# Patient Record
Sex: Male | Born: 1960 | Race: White | Hispanic: No | Marital: Single | Smoking: Current every day smoker
Health system: Southern US, Community
[De-identification: ages and names within clinical notes are randomized; demographics above are authoritative.]

## PROBLEM LIST (undated history)

## (undated) DIAGNOSIS — I10 Essential (primary) hypertension: Secondary | ICD-10-CM

## (undated) DIAGNOSIS — M109 Gout, unspecified: Secondary | ICD-10-CM

## (undated) DIAGNOSIS — I471 Supraventricular tachycardia, unspecified: Secondary | ICD-10-CM

## (undated) DIAGNOSIS — K219 Gastro-esophageal reflux disease without esophagitis: Secondary | ICD-10-CM

## (undated) DIAGNOSIS — Z720 Tobacco use: Secondary | ICD-10-CM

## (undated) DIAGNOSIS — E119 Type 2 diabetes mellitus without complications: Secondary | ICD-10-CM

## (undated) DIAGNOSIS — I5189 Other ill-defined heart diseases: Secondary | ICD-10-CM

## (undated) DIAGNOSIS — G43909 Migraine, unspecified, not intractable, without status migrainosus: Secondary | ICD-10-CM

## (undated) DIAGNOSIS — R079 Chest pain, unspecified: Secondary | ICD-10-CM

## (undated) DIAGNOSIS — I4510 Unspecified right bundle-branch block: Secondary | ICD-10-CM

## (undated) HISTORY — PX: CLAVICLE SURGERY: SHX598

## (undated) HISTORY — PX: MANDIBLE SURGERY: SHX707

## (undated) HISTORY — PX: HERNIA REPAIR: SHX51

## (undated) HISTORY — DX: Migraine, unspecified, not intractable, without status migrainosus: G43.909

## (undated) HISTORY — PX: ANKLE SURGERY: SHX546

## (undated) HISTORY — DX: Gastro-esophageal reflux disease without esophagitis: K21.9

---

## 2004-08-21 ENCOUNTER — Emergency Department: Payer: Self-pay | Admitting: Emergency Medicine

## 2004-08-27 ENCOUNTER — Observation Stay: Payer: Self-pay | Admitting: General Practice

## 2010-10-15 ENCOUNTER — Ambulatory Visit: Payer: Self-pay | Admitting: Family Medicine

## 2011-07-29 ENCOUNTER — Emergency Department: Payer: Self-pay | Admitting: Emergency Medicine

## 2014-05-05 ENCOUNTER — Emergency Department: Payer: Self-pay | Admitting: Emergency Medicine

## 2014-05-16 ENCOUNTER — Emergency Department: Payer: Self-pay | Admitting: Student

## 2015-02-12 ENCOUNTER — Ambulatory Visit: Payer: Self-pay

## 2015-02-26 ENCOUNTER — Other Ambulatory Visit: Payer: Self-pay

## 2015-03-05 ENCOUNTER — Ambulatory Visit: Payer: Self-pay

## 2015-03-05 DIAGNOSIS — E785 Hyperlipidemia, unspecified: Secondary | ICD-10-CM | POA: Insufficient documentation

## 2015-03-06 ENCOUNTER — Other Ambulatory Visit: Payer: Self-pay | Admitting: Nurse Practitioner

## 2015-03-06 DIAGNOSIS — R51 Headache: Principal | ICD-10-CM

## 2015-03-06 DIAGNOSIS — R519 Headache, unspecified: Secondary | ICD-10-CM

## 2015-03-12 ENCOUNTER — Other Ambulatory Visit: Payer: Self-pay | Admitting: Nurse Practitioner

## 2015-03-12 ENCOUNTER — Ambulatory Visit: Payer: Self-pay

## 2015-03-12 ENCOUNTER — Ambulatory Visit
Admission: RE | Admit: 2015-03-12 | Discharge: 2015-03-12 | Disposition: A | Discharge status: Home / Self Care | Payer: PRIVATE HEALTH INSURANCE | Source: Ambulatory Visit | Attending: Nurse Practitioner | Admitting: Nurse Practitioner

## 2015-03-12 ENCOUNTER — Ambulatory Visit
Admission: RE | Admit: 2015-03-12 | Discharge: 2015-03-12 | Disposition: A | Discharge status: Home / Self Care | Payer: Self-pay | Source: Ambulatory Visit | Attending: Nurse Practitioner | Admitting: Nurse Practitioner

## 2015-03-12 DIAGNOSIS — G9389 Other specified disorders of brain: Secondary | ICD-10-CM | POA: Insufficient documentation

## 2015-03-12 DIAGNOSIS — R0602 Shortness of breath: Secondary | ICD-10-CM

## 2015-03-12 DIAGNOSIS — R519 Headache, unspecified: Secondary | ICD-10-CM

## 2015-03-12 DIAGNOSIS — R51 Headache: Secondary | ICD-10-CM | POA: Insufficient documentation

## 2015-03-19 ENCOUNTER — Ambulatory Visit: Payer: PRIVATE HEALTH INSURANCE

## 2015-03-24 ENCOUNTER — Ambulatory Visit: Payer: PRIVATE HEALTH INSURANCE

## 2015-03-30 ENCOUNTER — Encounter: Payer: PRIVATE HEALTH INSURANCE | Admitting: Pharmacist

## 2015-05-21 ENCOUNTER — Other Ambulatory Visit: Payer: Self-pay

## 2015-05-28 ENCOUNTER — Ambulatory Visit: Payer: Self-pay

## 2015-05-28 DIAGNOSIS — E1110 Type 2 diabetes mellitus with ketoacidosis without coma: Secondary | ICD-10-CM | POA: Insufficient documentation

## 2015-06-23 ENCOUNTER — Emergency Department: Payer: Medicaid Other

## 2015-06-23 ENCOUNTER — Inpatient Hospital Stay
Admission: EM | Admit: 2015-06-23 | Discharge: 2015-06-25 | DRG: 603 | Disposition: A | Discharge status: Home / Self Care | Payer: Medicaid Other | Attending: Internal Medicine | Admitting: Internal Medicine

## 2015-06-23 ENCOUNTER — Encounter: Payer: Self-pay | Admitting: *Deleted

## 2015-06-23 DIAGNOSIS — F17210 Nicotine dependence, cigarettes, uncomplicated: Secondary | ICD-10-CM | POA: Diagnosis present

## 2015-06-23 DIAGNOSIS — E119 Type 2 diabetes mellitus without complications: Secondary | ICD-10-CM | POA: Diagnosis present

## 2015-06-23 DIAGNOSIS — L03114 Cellulitis of left upper limb: Principal | ICD-10-CM | POA: Diagnosis present

## 2015-06-23 DIAGNOSIS — W5501XA Bitten by cat, initial encounter: Secondary | ICD-10-CM | POA: Diagnosis present

## 2015-06-23 DIAGNOSIS — Z79899 Other long term (current) drug therapy: Secondary | ICD-10-CM

## 2015-06-23 DIAGNOSIS — I10 Essential (primary) hypertension: Secondary | ICD-10-CM | POA: Diagnosis present

## 2015-06-23 DIAGNOSIS — K219 Gastro-esophageal reflux disease without esophagitis: Secondary | ICD-10-CM | POA: Diagnosis present

## 2015-06-23 DIAGNOSIS — E785 Hyperlipidemia, unspecified: Secondary | ICD-10-CM | POA: Diagnosis present

## 2015-06-23 HISTORY — DX: Essential (primary) hypertension: I10

## 2015-06-23 HISTORY — DX: Type 2 diabetes mellitus without complications: E11.9

## 2015-06-23 LAB — BASIC METABOLIC PANEL
ANION GAP: 10 (ref 5–15)
BUN: 12 mg/dL (ref 6–20)
CALCIUM: 10.4 mg/dL — AB (ref 8.9–10.3)
CO2: 24 mmol/L (ref 22–32)
Chloride: 104 mmol/L (ref 101–111)
Creatinine, Ser: 1.19 mg/dL (ref 0.61–1.24)
Glucose, Bld: 131 mg/dL — ABNORMAL HIGH (ref 65–99)
POTASSIUM: 3.6 mmol/L (ref 3.5–5.1)
SODIUM: 138 mmol/L (ref 135–145)

## 2015-06-23 LAB — CBC
HEMATOCRIT: 46.8 % (ref 40.0–52.0)
HEMOGLOBIN: 15.9 g/dL (ref 13.0–18.0)
MCH: 30.9 pg (ref 26.0–34.0)
MCHC: 33.9 g/dL (ref 32.0–36.0)
MCV: 91.2 fL (ref 80.0–100.0)
Platelets: 179 10*3/uL (ref 150–440)
RBC: 5.13 MIL/uL (ref 4.40–5.90)
RDW: 14.8 % — ABNORMAL HIGH (ref 11.5–14.5)
WBC: 10.1 10*3/uL (ref 3.8–10.6)

## 2015-06-23 LAB — LACTIC ACID, PLASMA: LACTIC ACID, VENOUS: 1 mmol/L (ref 0.5–2.0)

## 2015-06-23 MED ORDER — HEPARIN SODIUM (PORCINE) 5000 UNIT/ML IJ SOLN
5000.0000 [IU] | Freq: Three times a day (TID) | INTRAMUSCULAR | Status: DC
  Administered 2015-06-23 – 2015-06-24 (×2): 5000 [IU] via SUBCUTANEOUS
  Filled 2015-06-23 (×3): qty 1

## 2015-06-23 MED ORDER — RABIES VACCINE, PCEC IM SUSR
1.0000 mL | Freq: Once | INTRAMUSCULAR | Status: AC
  Administered 2015-06-23: 1 mL via INTRAMUSCULAR
  Filled 2015-06-23: qty 1

## 2015-06-23 MED ORDER — HYDRALAZINE HCL 20 MG/ML IJ SOLN: 10.0000 mg | INTRAMUSCULAR | Status: DC | PRN

## 2015-06-23 MED ORDER — INSULIN ASPART 100 UNIT/ML ~~LOC~~ SOLN
0.0000 [IU] | Freq: Three times a day (TID) | SUBCUTANEOUS | Status: DC
  Filled 2015-06-23: qty 1

## 2015-06-23 MED ORDER — SODIUM CHLORIDE 0.9 % IV SOLN
3.0000 g | Freq: Three times a day (TID) | INTRAVENOUS | Status: DC
  Administered 2015-06-24 – 2015-06-25 (×4): 3 g via INTRAVENOUS
  Filled 2015-06-23 (×7): qty 3

## 2015-06-23 MED ORDER — GABAPENTIN 300 MG PO CAPS
300.0000 mg | ORAL_CAPSULE | Freq: Three times a day (TID) | ORAL | Status: DC
  Administered 2015-06-24 – 2015-06-25 (×5): 300 mg via ORAL
  Filled 2015-06-23 (×6): qty 1

## 2015-06-23 MED ORDER — SODIUM CHLORIDE 0.9 % IV SOLN
3.0000 g | Freq: Once | INTRAVENOUS | Status: AC
  Administered 2015-06-23: 3 g via INTRAVENOUS
  Filled 2015-06-23: qty 3

## 2015-06-23 MED ORDER — SODIUM CHLORIDE 0.9 % IV SOLN
INTRAVENOUS | Status: DC
  Administered 2015-06-23 – 2015-06-25 (×4): via INTRAVENOUS

## 2015-06-23 MED ORDER — ONDANSETRON HCL 4 MG PO TABS: 4.0000 mg | ORAL_TABLET | Freq: Four times a day (QID) | ORAL | Status: DC | PRN

## 2015-06-23 MED ORDER — TETANUS-DIPHTH-ACELL PERTUSSIS 5-2.5-18.5 LF-MCG/0.5 IM SUSP
0.5000 mL | Freq: Once | INTRAMUSCULAR | Status: AC
  Administered 2015-06-23: 0.5 mL via INTRAMUSCULAR
  Filled 2015-06-23: qty 0.5

## 2015-06-23 MED ORDER — ROSUVASTATIN CALCIUM 5 MG PO TABS
5.0000 mg | ORAL_TABLET | Freq: Every day | ORAL | Status: DC
  Administered 2015-06-24: 18:00:00 5 mg via ORAL
  Filled 2015-06-23: qty 1

## 2015-06-23 MED ORDER — ACETAMINOPHEN 325 MG PO TABS: 650.0000 mg | ORAL_TABLET | Freq: Four times a day (QID) | ORAL | Status: DC | PRN

## 2015-06-23 MED ORDER — OXYCODONE HCL 5 MG PO TABS
5.0000 mg | ORAL_TABLET | ORAL | Status: DC | PRN
  Administered 2015-06-24 – 2015-06-25 (×6): 5 mg via ORAL
  Filled 2015-06-23 (×7): qty 1

## 2015-06-23 MED ORDER — MORPHINE SULFATE (PF) 2 MG/ML IV SOLN: 2.0000 mg | INTRAVENOUS | Status: DC | PRN

## 2015-06-23 MED ORDER — PANTOPRAZOLE SODIUM 40 MG PO TBEC
40.0000 mg | DELAYED_RELEASE_TABLET | Freq: Every day | ORAL | Status: DC
Start: 2015-06-24 — End: 2015-06-25
  Administered 2015-06-24 – 2015-06-25 (×2): 40 mg via ORAL
  Filled 2015-06-23 (×2): qty 1

## 2015-06-23 MED ORDER — INSULIN ASPART 100 UNIT/ML ~~LOC~~ SOLN: 0.0000 [IU] | Freq: Every day | SUBCUTANEOUS | Status: DC

## 2015-06-23 MED ORDER — MORPHINE SULFATE (PF) 4 MG/ML IV SOLN
4.0000 mg | Freq: Once | INTRAVENOUS | Status: AC
  Administered 2015-06-23: 4 mg via INTRAVENOUS
  Filled 2015-06-23: qty 1

## 2015-06-23 MED ORDER — RABIES IMMUNE GLOBULIN 150 UNIT/ML IM INJ
20.0000 [IU]/kg | INJECTION | Freq: Once | INTRAMUSCULAR | Status: AC
  Administered 2015-06-23: 2025 [IU] via INTRAMUSCULAR
  Filled 2015-06-23: qty 13.5

## 2015-06-23 MED ORDER — ACETAMINOPHEN 650 MG RE SUPP
650.0000 mg | Freq: Four times a day (QID) | RECTAL | Status: DC | PRN
Start: 2015-06-23 — End: 2015-06-25

## 2015-06-23 MED ORDER — ONDANSETRON HCL 4 MG/2ML IJ SOLN
4.0000 mg | Freq: Four times a day (QID) | INTRAMUSCULAR | Status: DC | PRN
  Administered 2015-06-24: 01:00:00 4 mg via INTRAVENOUS
  Filled 2015-06-23: qty 2

## 2015-06-23 MED ORDER — AMLODIPINE BESYLATE 5 MG PO TABS
5.0000 mg | ORAL_TABLET | Freq: Every day | ORAL | Status: DC
  Administered 2015-06-24 – 2015-06-25 (×2): 5 mg via ORAL
  Filled 2015-06-23 (×2): qty 1

## 2015-06-23 NOTE — H&P (Signed)
Micheal Barrett    MR#:  [id number]  DATE OF BIRTH:  1961/09/09   DATE OF ADMISSION:  06/23/2015  PRIMARY CARE PHYSICIAN: Boyce Medici, FNP   REQUESTING/REFERRING PHYSICIAN: Archie Balboa  CHIEF COMPLAINT:   Chief Complaint  Patient presents with  . Animal Bite    HISTORY OF PRESENT ILLNESS:  Micheal Barrett  is a 54 y.o. male with a known history of diabetes non-insulin-requiring, essential hypertension presenting with left hand swelling. Describes getting a cat bite to the left hand about 2-3 days ago. He states that he was attempting to rescue a stray cat who had a weedeater straining wrapped around his neck. Fortunately he was able to free the cat unfortunately he has been in the left hand 3-4 times. Had immediate bleeding at the site. Over the next few days describes increased edema, redness, pain described as throbbing 5-6/10 intensity, nonradiating worsened with activity no relieving factors. Denies any subjective fevers or chills  PAST MEDICAL HISTORY:   Past Medical History  Diagnosis Date  . Hypertension     PAST SURGICAL HISTORY:  No past surgical history on file.  SOCIAL HISTORY:   Social History  Substance Use Topics  . Smoking status: Current Every Day Smoker  . Smokeless tobacco: Not on file  . Alcohol Use: Yes    FAMILY HISTORY:  No family history on file.  DRUG ALLERGIES:  No Known Allergies  REVIEW OF SYSTEMS:  REVIEW OF SYSTEMS:  CONSTITUTIONAL: Denies fevers, chills, fatigue, weakness.  EYES: Denies blurred vision, double vision, or eye pain.  EARS, NOSE, THROAT: Denies tinnitus, ear pain, hearing loss.  RESPIRATORY: denies cough, shortness of breath, wheezing  CARDIOVASCULAR: Denies chest pain, palpitations, edema.  GASTROINTESTINAL: Denies nausea, vomiting, diarrhea, abdominal pain.  GENITOURINARY: Denies dysuria, hematuria.  ENDOCRINE: Denies nocturia or thyroid  problems. HEMATOLOGIC AND LYMPHATIC: Denies easy bruising or bleeding.  SKIN:  rash left hand MUSCULOSKELETAL: Denies pain in neck, back, shoulder, knees, hips, or further arthritic symptoms.  NEUROLOGIC: Denies paralysis, paresthesias.  PSYCHIATRIC: Denies anxiety or depressive symptoms. Otherwise full review of systems performed by me is negative.   MEDICATIONS AT HOME:   Prior to Admission medications   Medication Sig Start Date End Date Taking? Authorizing Provider  amLODipine (NORVASC) 5 MG tablet Take 5 mg by mouth daily.   Yes Historical Provider, MD  gabapentin (NEURONTIN) 300 MG capsule Take 300 mg by mouth 3 (three) times daily.   Yes Historical Provider, MD  metFORMIN (GLUCOPHAGE) 500 MG tablet Take 500 mg by mouth every evening.   Yes Historical Provider, MD  omeprazole (PRILOSEC) 20 MG capsule Take 20 mg by mouth daily.   Yes Historical Provider, MD  rosuvastatin (CRESTOR) 5 MG tablet Take 5 mg by mouth daily at 6 PM.   Yes Historical Provider, MD      VITAL SIGNS:  Blood pressure 115/101, pulse 79, temperature 98.3 F (36.8 C), temperature source Oral, resp. rate 18, height 5\' 11"  (1.803 m), weight 220 lb (99.791 kg), SpO2 96 %.  PHYSICAL EXAMINATION:  VITAL SIGNS: Filed Vitals:   06/23/15 2200  BP: 115/101  Pulse: 79  Temp:   Resp:    GENERAL:53 y.o.male currently in no acute distress.  HEAD: Normocephalic, atraumatic.  EYES: Pupils equal, round, reactive to light. Extraocular muscles intact. No scleral icterus.  MOUTH: Moist mucosal membrane. Dentition intact. No abscess noted.  EAR, NOSE, THROAT: Clear without exudates.  No external lesions.  NECK: Supple. No thyromegaly. No nodules. No JVD.  PULMONARY: Clear to ascultation, without wheeze rails or rhonci. No use of accessory muscles, Good respiratory effort. good air entry bilaterally CHEST: Nontender to palpation.  CARDIOVASCULAR: S1 and S2. Regular rate and rhythm. No murmurs, rubs, or gallops. No edema.  Pedal pulses 2+ bilaterally.  GASTROINTESTINAL: Soft, nontender, nondistended. No masses. Positive bowel sounds. No hepatosplenomegaly.  MUSCULOSKELETAL: No swelling, clubbing. Range of motion full in all extremities. Edema of the left hand mainly the dorsum moving upward into the wrist NEUROLOGIC: Cranial nerves II through XII are intact. No gross focal neurological deficits. Sensation intact. Reflexes intact.  SKIN: Erythematous area left hand dorsum moving proximally up the wrist, punctate lesions 3 without any purulence or drainage. Otherwise No ulceration, lesions, rashes, or cyanosis. Skin warm and dry. Turgor intact.  PSYCHIATRIC: Mood, affect within normal limits. The patient is awake, alert and oriented x 3. Insight, judgment intact.    LABORATORY PANEL:   CBC  Recent Labs Lab 06/23/15 1841  WBC 10.1  HGB 15.9  HCT 46.8  PLT 179   ------------------------------------------------------------------------------------------------------------------  Chemistries   Recent Labs Lab 06/23/15 1841  NA 138  K 3.6  CL 104  CO2 24  GLUCOSE 131*  BUN 12  CREATININE 1.19  CALCIUM 10.4*   ------------------------------------------------------------------------------------------------------------------  Cardiac Enzymes No results for input(s): TROPONINI in the last 168 hours. ------------------------------------------------------------------------------------------------------------------  RADIOLOGY:  Dg Hand Complete Left  06/23/2015   CLINICAL DATA:  Bit by stray cat left hand, index and middle fingers. Swelling, warmth and pain left hand.  EXAM: LEFT HAND - COMPLETE 3+ VIEW  COMPARISON:  None.  FINDINGS: Old ulnar styloid fracture. Mild degenerate changes over the radiocarpal joint and carpal bones as well as first carpometacarpal and first MCP joints. No evidence of fracture, dislocation or foreign body.  IMPRESSION: No acute findings.   Electronically Signed   By: Marin Olp M.D.   On: 06/23/2015 21:41    EKG:   Orders placed or performed in visit on 03/12/15  . EKG 12-Lead    IMPRESSION AND PLAN:   54 year old Caucasian gentleman history of hyperlipidemia unspecified, type 2 diabetes uncomplicated presenting after a cat bite to left hand  1. Left hand cellulitis, cat bite: Started on Unasyn in the emergency department will continue Unasyn every 6 hours if there is improvement can hopefully be switched to oral medications as soon as tomorrow. Has received tetanus vaccine and is scheduled to receive the rabies vaccine from the emergency department staff 2. Type 2 diabetes uncomplicated: Hold oral agents at insulin sliding scale 3. Essential hypertension: Continue with Norvasc added as needed hydralazine 4. GERD without esophagitis: PPI therapy 5. Hyperlipidemia unspecified: Crestor 6. Venous thromboembolism prophylactic: Heparin subcutaneous  All the records are reviewed and case discussed with ED provider. Management plans discussed with the patient, family and they are in agreement.  CODE STATUS: Full  TOTAL TIME TAKING CARE OF THIS PATIENT: 35 minutes.    Gussie Murton,  Karenann Cai.D on 06/23/2015 at 10:35 PM  Between 7am to 6pm - Pager - [phone]  After 6pm: House Pager: - [facility]  Office  [phone]  CC: Primary care physician; Boyce Medici, FNP

## 2015-06-23 NOTE — ED Provider Notes (Signed)
[facility] Provider Note   ____________________________________________  Time seen: 2100  I have reviewed the triage vital signs and the nursing notes.   HISTORY  Chief Complaint Animal Bite   History limited by: Not Limited   HPI Micheal Barrett is a 54 y.o. male who presents to the emergency department today because of concerns for pain, swelling and redness to his left hand. Patient states that he was bit by a cat 2 days ago. He states this was a That his family had taken it off the street roughly 3 weeks ago. They Hit him after he tried to free the cat from a tangled wire. He is unsure of the cats vaccinated status. The patient denies any fevers or nausea or vomiting. Patient is unsure when his last tetanus shot was.   Past Medical History  Diagnosis Date  . Hypertension     There are no active problems to display for this patient.   No past surgical history on file.  No current outpatient prescriptions on file.  Allergies Review of patient's allergies indicates no known allergies.  No family history on file.  Social History Social History  Substance Use Topics  . Smoking status: Current Every Day Smoker  . Smokeless tobacco: None  . Alcohol Use: Yes    Review of Systems  Constitutional: Negative for fever. Cardiovascular: Negative for chest pain. Respiratory: Negative for shortness of breath. Gastrointestinal: Negative for abdominal pain, vomiting and diarrhea. Genitourinary: Negative for dysuria. Musculoskeletal: Left hand pain and swelling Skin: Left hand redness, swelling and multiple small puncture wounds. Neurological: Negative for headaches, focal weakness or numbness. 10-point ROS otherwise negative.  ____________________________________________   PHYSICAL EXAM:  VITAL SIGNS: ED Triage Vitals  Enc Vitals Group     BP 06/23/15 1832 131/96 mmHg     Pulse Rate 06/23/15 1832 91     Resp 06/23/15  1832 18     Temp 06/23/15 1832 98.3 F (36.8 C)     Temp Source 06/23/15 1832 Oral     SpO2 06/23/15 1832 97 %     Weight 06/23/15 1832 220 lb (99.791 kg)     Height 06/23/15 1832 5\' 11"  (1.803 m)     Head Cir --      Peak Flow --      Pain Score 06/23/15 1832 9   Constitutional: Alert and oriented. Well appearing and in no distress. Eyes: Conjunctivae are normal. PERRL. Normal extraocular movements. ENT   Head: Normocephalic and atraumatic.   Nose: No congestion/rhinnorhea.   Mouth/Throat: Mucous membranes are moist.   Neck: No stridor. Hematological/Lymphatic/Immunilogical: No cervical lymphadenopathy. Cardiovascular: Normal rate, regular rhythm.  No murmurs, rubs, or gallops. Respiratory: Normal respiratory effort without tachypnea nor retractions. Breath sounds are clear and equal bilaterally. No wheezes/rales/rhonchi. Gastrointestinal: Soft and nontender. No distention.  Genitourinary: Deferred Musculoskeletal: Normal range of motion in all extremities. No joint effusions.  No lower extremity tenderness nor edema. Neurologic:  Normal speech and language. No gross focal neurologic deficits are appreciated. Speech is normal.  Skin:  Skin is warm, dry. Swelling and erythema to the left hand, primarily in the first and second digits however extends down past the wrist. Small puncture wounds noted at the base of the first digit and the proximal phalanx of the second digit. No pus was elicited. Compartments were soft. No pain with extension of the first or second digits. Psychiatric: Mood and affect are normal. Speech and behavior are normal. Patient  exhibits appropriate insight and judgment.  ____________________________________________    LABS (pertinent positives/negatives)  Labs Reviewed  BASIC METABOLIC PANEL - Abnormal; Notable for the following:    Glucose, Bld 131 (*)    Calcium 10.4 (*)    All other components within normal limits  CBC - Abnormal; Notable for  the following:    RDW 14.8 (*)    All other components within normal limits  LACTIC ACID, PLASMA  LACTIC ACID, PLASMA     ____________________________________________   EKG  None  ____________________________________________    RADIOLOGY  Left hand x-ray IMPRESSION: No acute findings.  I, Treyon Wymore, personally viewed and evaluated these images (plain radiographs) as part of my medical decision making.   ____________________________________________   PROCEDURES  Procedure(s) performed: None  Critical Care performed: No  ____________________________________________   INITIAL IMPRESSION / ASSESSMENT AND PLAN / ED COURSE  Pertinent labs & imaging results that were available during my care of the patient were reviewed by me and considered in my medical decision making (see chart for details).  Patient presented to the emergency department today with concerns for left hand pain and swelling after getting bit by cat. On exam patient does have swelling erythema and tenderness to his left hand with a couple puncture wounds. X-ray did not show any foreign bodies. Given the extensive swelling and erythema do think patient would benefit from IV antibiotics. Additionally I will update his tetanus. Furthermore given that this was a stray cat with unknown vaccination status we will give rabies vaccine immunoglobulin.  ____________________________________________   FINAL CLINICAL IMPRESSION(S) / ED DIAGNOSES  Final diagnoses:  Cat bite, initial encounter  Cellulitis of left upper extremity     Nance Pear, MD 06/23/15 2312

## 2015-06-23 NOTE — ED Notes (Signed)
Pt states that he was bit by a stray cat multiple times on his left hand. Pt now with swelling, warmth and pain to the left hand.

## 2015-06-24 DIAGNOSIS — I10 Essential (primary) hypertension: Secondary | ICD-10-CM | POA: Diagnosis present

## 2015-06-24 DIAGNOSIS — L03114 Cellulitis of left upper limb: Secondary | ICD-10-CM | POA: Diagnosis present

## 2015-06-24 DIAGNOSIS — W5501XA Bitten by cat, initial encounter: Secondary | ICD-10-CM | POA: Diagnosis present

## 2015-06-24 DIAGNOSIS — F17210 Nicotine dependence, cigarettes, uncomplicated: Secondary | ICD-10-CM | POA: Diagnosis present

## 2015-06-24 DIAGNOSIS — E785 Hyperlipidemia, unspecified: Secondary | ICD-10-CM | POA: Diagnosis present

## 2015-06-24 DIAGNOSIS — Z79899 Other long term (current) drug therapy: Secondary | ICD-10-CM | POA: Diagnosis not present

## 2015-06-24 DIAGNOSIS — E119 Type 2 diabetes mellitus without complications: Secondary | ICD-10-CM | POA: Diagnosis present

## 2015-06-24 DIAGNOSIS — K219 Gastro-esophageal reflux disease without esophagitis: Secondary | ICD-10-CM | POA: Diagnosis present

## 2015-06-24 LAB — GLUCOSE, CAPILLARY
GLUCOSE-CAPILLARY: 119 mg/dL — AB (ref 65–99)
Glucose-Capillary: 106 mg/dL — ABNORMAL HIGH (ref 65–99)
Glucose-Capillary: 107 mg/dL — ABNORMAL HIGH (ref 65–99)
Glucose-Capillary: 111 mg/dL — ABNORMAL HIGH (ref 65–99)
Glucose-Capillary: 142 mg/dL — ABNORMAL HIGH (ref 65–99)

## 2015-06-24 LAB — CBC WITH DIFFERENTIAL/PLATELET
Basophils Absolute: 0 10*3/uL (ref 0–0.1)
Basophils Relative: 1 %
Eosinophils Absolute: 0.1 10*3/uL (ref 0–0.7)
Eosinophils Relative: 1 %
HEMATOCRIT: 41.7 % (ref 40.0–52.0)
HEMOGLOBIN: 14.2 g/dL (ref 13.0–18.0)
LYMPHS PCT: 16 %
Lymphs Abs: 1.4 10*3/uL (ref 1.0–3.6)
MCH: 30.9 pg (ref 26.0–34.0)
MCHC: 34 g/dL (ref 32.0–36.0)
MCV: 91.1 fL (ref 80.0–100.0)
MONO ABS: 0.6 10*3/uL (ref 0.2–1.0)
MONOS PCT: 7 %
NEUTROS ABS: 6.5 10*3/uL (ref 1.4–6.5)
Neutrophils Relative %: 75 %
Platelets: 159 10*3/uL (ref 150–440)
RBC: 4.58 MIL/uL (ref 4.40–5.90)
RDW: 14.8 % — AB (ref 11.5–14.5)
WBC: 8.7 10*3/uL (ref 3.8–10.6)

## 2015-06-24 LAB — BASIC METABOLIC PANEL
ANION GAP: 5 (ref 5–15)
BUN: 13 mg/dL (ref 6–20)
CALCIUM: 9.1 mg/dL (ref 8.9–10.3)
CHLORIDE: 108 mmol/L (ref 101–111)
CO2: 25 mmol/L (ref 22–32)
CREATININE: 0.93 mg/dL (ref 0.61–1.24)
GFR calc Af Amer: >60 mL/min (ref >60)
GFR calc non Af Amer: >60 mL/min (ref >60)
GLUCOSE: 145 mg/dL — AB (ref 65–99)
Potassium: 4.3 mmol/L (ref 3.5–5.1)
Sodium: 138 mmol/L (ref 135–145)

## 2015-06-24 MED ORDER — METFORMIN HCL 500 MG PO TABS: 500.0000 mg | ORAL_TABLET | Freq: Two times a day (BID) | ORAL | Status: DC

## 2015-06-24 MED ORDER — METFORMIN HCL 500 MG PO TABS
500.0000 mg | ORAL_TABLET | Freq: Every day | ORAL | Status: DC
  Administered 2015-06-24 – 2015-06-25 (×2): 500 mg via ORAL
  Filled 2015-06-24 (×2): qty 1

## 2015-06-24 MED ORDER — ENOXAPARIN SODIUM 40 MG/0.4ML ~~LOC~~ SOLN
40.0000 mg | SUBCUTANEOUS | Status: DC
  Administered 2015-06-24: 40 mg via SUBCUTANEOUS
  Filled 2015-06-24: qty 0.4

## 2015-06-24 NOTE — Care Management (Signed)
Admitted to [facility] under observation status with the diagnosis of cellulitis of the hand. Lives with Lucina Mellow. Sees Butch Penny s Odem FNP. Medicaid is pending.  IV Unasyn continues. Shelbie Ammons RN MSN Care Management [phone]

## 2015-06-24 NOTE — Progress Notes (Signed)
[facility] NAME: Micheal Barrett    MR#:  [id number]  DATE OF BIRTH:  [birth year]  SUBJECTIVE:  CHIEF COMPLAINT:   Chief Complaint  Patient presents with  . Animal Bite   - The left hand is still swollen and painful. Erythema is improving. - No fevers.  REVIEW OF SYSTEMS:  Review of Systems  Constitutional: Negative for fever and chills.  HENT: Negative for ear discharge and ear pain.   Eyes: Negative for blurred vision and double vision.  Respiratory: Negative for cough, shortness of breath and wheezing.   Cardiovascular: Negative for chest pain and palpitations.  Gastrointestinal: Negative for nausea, vomiting, abdominal pain, diarrhea and constipation.  Genitourinary: Negative for dysuria.  Musculoskeletal:       Left hand swollen and erythematous- more around the thenar area and dorsum of the thumb.  Neurological: Negative for dizziness, sensory change, speech change, focal weakness, seizures, weakness and headaches.    DRUG ALLERGIES:  No Known Allergies  VITALS:  Blood pressure 115/76, pulse 80, temperature 98 F (36.7 C), temperature source Oral, resp. rate 18, height 5\' 11"  (1.803 m), weight 99.701 kg (219 lb 12.8 oz), SpO2 93 %.  PHYSICAL EXAMINATION:  Physical Exam  GENERAL:  54 y.o.-year-old patient lying in the bed with no acute distress.  EYES: Pupils equal, round, reactive to light and accommodation. No scleral icterus. Extraocular muscles intact.  HEENT: Head atraumatic, normocephalic. Oropharynx and nasopharynx clear.  NECK:  Supple, no jugular venous distention. No thyroid enlargement, no tenderness.  LUNGS: Normal breath sounds bilaterally, no wheezing, rales,rhonchi or crepitation. No use of accessory muscles of respiration.  CARDIOVASCULAR: S1, S2 normal. No murmurs, rubs, or gallops.  ABDOMEN: Soft, nontender, nondistended. Bowel sounds present. No organomegaly or mass.  EXTREMITIES: No  pedal edema, cyanosis, or clubbing.  Left hand dorsum is swollen, more around the thumb and also swelling of the thenar region. No erythema noted. Tender to touch. Scratches seen along the index finger too. NEUROLOGIC: Cranial nerves II through XII are intact. Muscle strength 5/5 in all extremities. Sensation intact. Gait not checked.  PSYCHIATRIC: The patient is alert and oriented x 3.  SKIN: No obvious rash, lesion, or ulcer.    LABORATORY PANEL:   CBC  Recent Labs Lab 06/24/15 0513  WBC 8.7  HGB 14.2  HCT 41.7  PLT 159   ------------------------------------------------------------------------------------------------------------------  Chemistries   Recent Labs Lab 06/24/15 0513  NA 138  K 4.3  CL 108  CO2 25  GLUCOSE 145*  BUN 13  CREATININE 0.93  CALCIUM 9.1   ------------------------------------------------------------------------------------------------------------------  Cardiac Enzymes No results for input(s): TROPONINI in the last 168 hours. ------------------------------------------------------------------------------------------------------------------  RADIOLOGY:  Dg Hand Complete Left  06/23/2015   CLINICAL DATA:  Bit by stray cat left hand, index and middle fingers. Swelling, warmth and pain left hand.  EXAM: LEFT HAND - COMPLETE 3+ VIEW  COMPARISON:  None.  FINDINGS: Old ulnar styloid fracture. Mild degenerate changes over the radiocarpal joint and carpal bones as well as first carpometacarpal and first MCP joints. No evidence of fracture, dislocation or foreign body.  IMPRESSION: No acute findings.   Electronically Signed   By: Marin Olp M.D.   On: 06/23/2015 21:41    EKG:   Orders placed or performed in visit on 03/12/15  . EKG 12-Lead    ASSESSMENT AND PLAN:   54 year old male with history of hypertension, non-insulin-dependent diabetes mellitus admitted for cellulitis after cat  bite.  1. Left hand cellulitis after cat bite-still has the  swelling and pain -Continue Unasyn for now. X-ray with no bone involvement. -Change to oral antibiotics tomorrow if improving.  2. hypertension-continue Norvasc  3. hyperlipidemia-continue statin  4. Diabetes mellitus-continue metformin and sliding scale insulin  5. DVT prophylaxis-changed to Lovenox  All the records are reviewed and case discussed with Care Management/Social Workerr. Management plans discussed with the patient, family and they are in agreement.  CODE STATUS: Full Code  TOTAL TIME TAKING CARE OF THIS PATIENT: 37 minutes.   POSSIBLE D/C TOMORROW, DEPENDING ON CLINICAL CONDITION.   Gladstone Lighter M.D on 06/24/2015 at 11:49 AM  Between 7am to 6pm - Pager - [phone]  After 6pm go to [email or web address] - password [email or web address] [facility]  Office  [phone]  CC: Primary care physician; Boyce Medici, FNP

## 2015-06-24 NOTE — Plan of Care (Signed)
Problem: Discharge Progression Outcomes Goal: Barriers To Progression Addressed/Resolved Individualization of care  Individualization of Care Pt prefers to be called Micheal Barrett Hx of Diabetes and HTN.  Admitted for animal (cat) bite with resulting edema and cellulitis to left hand.    Goal: Other Discharge Outcomes/Goals Pain:  Patient with complaints of pain this shift which were resolved with administration of Oxycodone.   Hemodynamically:  Patient afebrile and VSS this shift.  BP 140/93 mmHg  Pulse 86  Temp(Src) 98.3 F (36.8 C) (Oral)  Resp 20  Ht 5\' 11"  (1.803 m)  Wt 219 lb 12.8 oz (99.701 kg)  BMI 30.67 kg/m2  SpO2 97% Diet:  Patient on heart health diet.  He had lingering nausea from morphine administration and vomited upon admittance to floor and first dose of pain medication.  He was given Zofran which was effective and he was able to tolerate sandwich tray and subsequent medications. Activity:  Patient is up independently.  He has steady gait and is a moderate fall risk. Wound:  Ice pack given for wound with stated relief.  There are small bite marks on left index finger and thumb.  Entire hand is red and swollen.

## 2015-06-24 NOTE — Plan of Care (Signed)
Problem: Discharge Progression Outcomes Goal: Barriers To Progression Addressed/Resolved Individualization of care Likes to be called Micheal Barrett. Lives at home with girlfriend. Has history of hypertension, controlled by medications. Ambulates in room.

## 2015-06-25 LAB — BASIC METABOLIC PANEL
Anion gap: 7 (ref 5–15)
BUN: 13 mg/dL (ref 6–20)
CHLORIDE: 106 mmol/L (ref 101–111)
CO2: 26 mmol/L (ref 22–32)
CREATININE: 0.95 mg/dL (ref 0.61–1.24)
Calcium: 9.2 mg/dL (ref 8.9–10.3)
GFR calc Af Amer: >60 mL/min (ref >60)
GFR calc non Af Amer: >60 mL/min (ref >60)
GLUCOSE: 129 mg/dL — AB (ref 65–99)
Potassium: 3.9 mmol/L (ref 3.5–5.1)
Sodium: 139 mmol/L (ref 135–145)

## 2015-06-25 LAB — GLUCOSE, CAPILLARY: Glucose-Capillary: 138 mg/dL — ABNORMAL HIGH (ref 65–99)

## 2015-06-25 MED ORDER — OXYCODONE HCL 5 MG PO TABS: 5.0000 mg | ORAL_TABLET | Freq: Four times a day (QID) | ORAL | Status: DC | PRN

## 2015-06-25 MED ORDER — AMOXICILLIN-POT CLAVULANATE 875-125 MG PO TABS: 1.0000 | ORAL_TABLET | Freq: Two times a day (BID) | ORAL | Status: AC

## 2015-06-25 NOTE — Plan of Care (Signed)
Problem: Discharge Progression Outcomes Goal: Barriers To Progression Addressed/Resolved Individualization of care  Individualization of Care Pt prefers to be called Micheal Barrett Hx of Diabetes and HTN. Admitted for animal (cat) bite with resulting edema and cellulitis to left hand.   Goal: Other Discharge Outcomes/Goals Pain: Patient with complaints of pain this shift which were resolved with administration of Oxycodone.  Hemodynamically: Patient afebrile and VSS this shift.BP 107/65 mmHg  Pulse 75  Temp(Src) 98.8 F (37.1 C) (Oral)  Resp 20  Ht 5\' 11"  (1.803 m)  Wt 219 lb 12.8 oz (99.701 kg)  BMI 30.67 kg/m2  SpO2 95% Diet: Patient on heart health diet. Tolerating well no N/V this shift. Activity: Patient is up independently. He has steady gait and is a moderate fall risk. Wound: Ice pack given for wound with stated relief. There are small bite marks on left index finger and thumb.

## 2015-06-25 NOTE — Discharge Summary (Signed)
[facility] NAME: Micheal Barrett    MR#:  [id number]  DATE OF BIRTH:  06-Mar-1961  DATE OF ADMISSION:  06/23/2015 ADMITTING PHYSICIAN: Lytle Butte, MD  DATE OF DISCHARGE: 06/25/15  PRIMARY CARE PHYSICIAN: Boyce Medici, FNP    ADMISSION DIAGNOSIS:  Cellulitis of left upper extremity [L03.114] Cat bite, initial encounter [W55.01XA]  DISCHARGE DIAGNOSIS:  Active Problems:   Cellulitis of hand, left   SECONDARY DIAGNOSIS:   Past Medical History  Diagnosis Date  . Hypertension   . Diabetes mellitus without complication     HOSPITAL COURSE:   54 year old male with history of hypertension, non-insulin-dependent diabetes mellitus admitted for cellulitis after cat bite.  1. Left hand cellulitis after cat bite-still has the swelling and pain -was on unasyn in the hospital. X-ray with no bone involvement. -slow improvement noted, still has some pain at the bite sites. - discharge on augmentin  2. hypertension-continue Norvasc  3. hyperlipidemia-continue statin  4. Diabetes mellitus-continue metformin  Discharge today  DISCHARGE CONDITIONS:   stable  CONSULTS OBTAINED:  Treatment Team:  Lytle Butte, MD  DRUG ALLERGIES:  No Known Allergies  DISCHARGE MEDICATIONS:   Current Discharge Medication List    START taking these medications   Details  amoxicillin-clavulanate (AUGMENTIN) 875-125 MG per tablet Take 1 tablet by mouth 2 (two) times daily. Qty: 20 tablet, Refills: 0    oxyCODONE (OXY IR/ROXICODONE) 5 MG immediate release tablet Take 1-2 tablets (5-10 mg total) by mouth every 6 (six) hours as needed for moderate pain or severe pain. Qty: 20 tablet, Refills: 0      CONTINUE these medications which have NOT CHANGED   Details  amLODipine (NORVASC) 5 MG tablet Take 5 mg by mouth daily.    gabapentin (NEURONTIN) 300 MG capsule Take 300 mg by mouth 3 (three) times daily.    metFORMIN (GLUCOPHAGE)  500 MG tablet Take 500 mg by mouth every evening.    omeprazole (PRILOSEC) 20 MG capsule Take 20 mg by mouth daily.    rosuvastatin (CRESTOR) 5 MG tablet Take 5 mg by mouth daily at 6 PM.         DISCHARGE INSTRUCTIONS:   1. PCP f/u in 2 weeks  If you experience worsening of your admission symptoms, develop shortness of breath, life threatening emergency, suicidal or homicidal thoughts you must seek medical attention immediately by calling 911 or calling your MD immediately  if symptoms less severe.  You Must read complete instructions/literature along with all the possible adverse reactions/side effects for all the Medicines you take and that have been prescribed to you. Take any new Medicines after you have completely understood and accept all the possible adverse reactions/side effects.   Please note  You were cared for by a hospitalist during your hospital stay. If you have any questions about your discharge medications or the care you received while you were in the hospital after you are discharged, you can call the unit and asked to speak with the hospitalist on call if the hospitalist that took care of you is not available. Once you are discharged, your primary care physician will handle any further medical issues. Please note that NO REFILLS for any discharge medications will be authorized once you are discharged, as it is imperative that you return to your primary care physician (or establish a relationship with a primary care physician if you do not have one) for your aftercare needs so  that they can reassess your need for medications and monitor your lab values.    Today   CHIEF COMPLAINT:   Chief Complaint  Patient presents with  . Animal Bite    VITAL SIGNS:  Blood pressure 141/86, pulse 82, temperature 98.1 F (36.7 C), temperature source Oral, resp. rate 18, height 5\' 11"  (1.803 m), weight 99.701 kg (219 lb 12.8 oz), SpO2 95 %.  I/O:   Intake/Output Summary (Last  24 hours) at 06/25/15 0941 Last data filed at 06/25/15 1610  Gross per 24 hour  Intake 2413.33 ml  Output   2500 ml  Net -86.67 ml    PHYSICAL EXAMINATION:   Physical Exam  GENERAL: 54 y.o.-year-old patient lying in the bed with no acute distress.  EYES: Pupils equal, round, reactive to light and accommodation. No scleral icterus. Extraocular muscles intact.  HEENT: Head atraumatic, normocephalic. Oropharynx and nasopharynx clear.  NECK: Supple, no jugular venous distention. No thyroid enlargement, no tenderness.  LUNGS: Normal breath sounds bilaterally, no wheezing, rales,rhonchi or crepitation. No use of accessory muscles of respiration.  CARDIOVASCULAR: S1, S2 normal. No murmurs, rubs, or gallops.  ABDOMEN: Soft, nontender, nondistended. Bowel sounds present. No organomegaly or mass.  EXTREMITIES: No pedal edema, cyanosis, or clubbing.  Left hand dorsum swelling is better, swelling more aorund the thumb and also swelling of the thenar region. No erythema noted. Tender to touch. Scratches seen along the index finger too. NEUROLOGIC: Cranial nerves II through XII are intact. Muscle strength 5/5 in all extremities. Sensation intact. Gait not checked.  PSYCHIATRIC: The patient is alert and oriented x 3.  SKIN: No obvious rash, lesion, or ulcer.   DATA REVIEW:   CBC  Recent Labs Lab 06/24/15 0513  WBC 8.7  HGB 14.2  HCT 41.7  PLT 159    Chemistries   Recent Labs Lab 06/25/15 0505  NA 139  K 3.9  CL 106  CO2 26  GLUCOSE 129*  BUN 13  CREATININE 0.95  CALCIUM 9.2    Cardiac Enzymes No results for input(s): TROPONINI in the last 168 hours.  Microbiology Results  No results found for this or any previous visit.  RADIOLOGY:  Dg Hand Complete Left  06/23/2015   CLINICAL DATA:  Bit by stray cat left hand, index and middle fingers. Swelling, warmth and pain left hand.  EXAM: LEFT HAND - COMPLETE 3+ VIEW  COMPARISON:  None.  FINDINGS: Old ulnar styloid  fracture. Mild degenerate changes over the radiocarpal joint and carpal bones as well as first carpometacarpal and first MCP joints. No evidence of fracture, dislocation or foreign body.  IMPRESSION: No acute findings.   Electronically Signed   By: Marin Olp M.D.   On: 06/23/2015 21:41    EKG:   Orders placed or performed in visit on 03/12/15  . EKG 12-Lead      Management plans discussed with the patient, family and they are in agreement.  CODE STATUS:     Code Status Orders        Start     Ordered   06/23/15 2158  Full code   Continuous     06/23/15 2159      TOTAL TIME TAKING CARE OF THIS PATIENT: 37 minutes.    Gladstone Lighter M.D on 06/25/2015 at 9:41 AM  Between 7am to 6pm - Pager - [phone]  After 6pm go to [email or web address] - password [email or web address] [facility]  [facility]  Office  [phone]  CC: Primary care physician; Butch Penny  Bethena Roys, FNP

## 2015-06-25 NOTE — Progress Notes (Signed)
Patient discharged home with family via wheelchair by nursing staff Nikitia Asbill S, RN  

## 2015-06-25 NOTE — Progress Notes (Signed)
Discharge instructions given and went over with patient at bedside. Prescriptions given and in hand. All questions answered. Patient to be discharged home. Awaiting transportation. Madlyn Frankel, RN

## 2015-07-02 ENCOUNTER — Ambulatory Visit: Payer: Self-pay

## 2015-07-02 DIAGNOSIS — I10 Essential (primary) hypertension: Secondary | ICD-10-CM | POA: Insufficient documentation

## 2015-07-15 ENCOUNTER — Ambulatory Visit: Payer: Self-pay | Admitting: Ophthalmology

## 2015-08-03 ENCOUNTER — Encounter: Payer: PRIVATE HEALTH INSURANCE | Admitting: Pharmacist

## 2015-08-03 ENCOUNTER — Encounter (INDEPENDENT_AMBULATORY_CARE_PROVIDER_SITE_OTHER): Payer: Self-pay

## 2015-08-20 ENCOUNTER — Other Ambulatory Visit: Payer: Self-pay

## 2015-08-27 ENCOUNTER — Ambulatory Visit: Payer: Self-pay

## 2015-09-09 ENCOUNTER — Ambulatory Visit: Payer: Self-pay | Admitting: Internal Medicine

## 2015-11-11 DIAGNOSIS — E785 Hyperlipidemia, unspecified: Secondary | ICD-10-CM

## 2015-11-11 DIAGNOSIS — E1110 Type 2 diabetes mellitus with ketoacidosis without coma: Secondary | ICD-10-CM

## 2015-11-11 DIAGNOSIS — I10 Essential (primary) hypertension: Secondary | ICD-10-CM

## 2015-12-08 ENCOUNTER — Other Ambulatory Visit: Payer: Self-pay

## 2015-12-08 DIAGNOSIS — E119 Type 2 diabetes mellitus without complications: Secondary | ICD-10-CM

## 2015-12-08 DIAGNOSIS — I152 Hypertension secondary to endocrine disorders: Secondary | ICD-10-CM

## 2015-12-08 DIAGNOSIS — E785 Hyperlipidemia, unspecified: Secondary | ICD-10-CM

## 2015-12-09 ENCOUNTER — Ambulatory Visit: Payer: Self-pay

## 2015-12-09 DIAGNOSIS — E119 Type 2 diabetes mellitus without complications: Secondary | ICD-10-CM

## 2015-12-09 DIAGNOSIS — E785 Hyperlipidemia, unspecified: Secondary | ICD-10-CM

## 2015-12-09 DIAGNOSIS — I152 Hypertension secondary to endocrine disorders: Secondary | ICD-10-CM

## 2015-12-10 LAB — URINALYSIS
Bilirubin, UA: NEGATIVE
LEUKOCYTES UA: NEGATIVE
NITRITE UA: NEGATIVE
PH UA: 5.5 (ref 5.0–7.5)
RBC, UA: NEGATIVE
Specific Gravity, UA: >=1.03 — AB (ref 1.005–1.030)
Urobilinogen, Ur: 1 mg/dL (ref 0.2–1.0)

## 2015-12-10 LAB — CBC WITH DIFFERENTIAL/PLATELET
Basophils Absolute: 0 10*3/uL (ref 0.0–0.2)
Basos: 1 %
EOS (ABSOLUTE): 0.2 10*3/uL (ref 0.0–0.4)
EOS: 2 %
HEMATOCRIT: 48.2 % (ref 37.5–51.0)
Hemoglobin: 16.6 g/dL (ref 12.6–17.7)
IMMATURE GRANULOCYTES: 0 %
Immature Grans (Abs): 0 10*3/uL (ref 0.0–0.1)
Lymphocytes Absolute: 2.4 10*3/uL (ref 0.7–3.1)
Lymphs: 29 %
MCH: 30.1 pg (ref 26.6–33.0)
MCHC: 34.4 g/dL (ref 31.5–35.7)
MCV: 88 fL (ref 79–97)
MONOCYTES: 6 %
MONOS ABS: 0.5 10*3/uL (ref 0.1–0.9)
NEUTROS PCT: 62 %
Neutrophils Absolute: 5.4 10*3/uL (ref 1.4–7.0)
Platelets: 223 10*3/uL (ref 150–379)
RBC: 5.51 x10E6/uL (ref 4.14–5.80)
RDW: 14.8 % (ref 12.3–15.4)
WBC: 8.6 10*3/uL (ref 3.4–10.8)

## 2015-12-10 LAB — MICROALBUMIN / CREATININE URINE RATIO
Creatinine, Urine: 479.6 mg/dL
MICROALB/CREAT RATIO: 31.8 mg/g{creat} — AB (ref 0.0–30.0)
Microalbumin, Urine: 152.7 ug/mL

## 2015-12-10 LAB — LIPID PANEL
CHOL/HDL RATIO: 5.4 ratio — AB (ref 0.0–5.0)
CHOLESTEROL TOTAL: 147 mg/dL (ref 100–199)
HDL: 27 mg/dL — AB (ref >39)
LDL Calculated: 85 mg/dL (ref 0–99)
TRIGLYCERIDES: 176 mg/dL — AB (ref 0–149)
VLDL Cholesterol Cal: 35 mg/dL (ref 5–40)

## 2015-12-10 LAB — COMPREHENSIVE METABOLIC PANEL
ALK PHOS: 80 IU/L (ref 39–117)
ALT: 37 IU/L (ref 0–44)
AST: 28 IU/L (ref 0–40)
Albumin/Globulin Ratio: 1.7 (ref 1.1–2.5)
Albumin: 4.7 g/dL (ref 3.5–5.5)
BUN/Creatinine Ratio: 16 (ref 9–20)
BUN: 15 mg/dL (ref 6–24)
Bilirubin Total: 0.3 mg/dL (ref 0.0–1.2)
CO2: 22 mmol/L (ref 18–29)
CREATININE: 0.95 mg/dL (ref 0.76–1.27)
Calcium: 9.8 mg/dL (ref 8.7–10.2)
Chloride: 102 mmol/L (ref 96–106)
GFR calc Af Amer: 105 mL/min/{1.73_m2} (ref >59)
GFR calc non Af Amer: 91 mL/min/{1.73_m2} (ref >59)
GLUCOSE: 152 mg/dL — AB (ref 65–99)
Globulin, Total: 2.7 g/dL (ref 1.5–4.5)
Potassium: 4.1 mmol/L (ref 3.5–5.2)
SODIUM: 141 mmol/L (ref 134–144)
Total Protein: 7.4 g/dL (ref 6.0–8.5)

## 2015-12-10 LAB — HEMOGLOBIN A1C
ESTIMATED AVERAGE GLUCOSE: 146 mg/dL
HEMOGLOBIN A1C: 6.7 % — AB (ref 4.8–5.6)

## 2015-12-15 ENCOUNTER — Ambulatory Visit: Payer: Self-pay | Admitting: Nurse Practitioner

## 2015-12-15 VITALS — BP 130/88 | HR 73 | Wt 229.0 lb

## 2015-12-15 DIAGNOSIS — M25519 Pain in unspecified shoulder: Secondary | ICD-10-CM

## 2015-12-15 DIAGNOSIS — I10 Essential (primary) hypertension: Secondary | ICD-10-CM

## 2015-12-15 DIAGNOSIS — E1110 Type 2 diabetes mellitus with ketoacidosis without coma: Secondary | ICD-10-CM

## 2015-12-15 DIAGNOSIS — R809 Proteinuria, unspecified: Secondary | ICD-10-CM

## 2015-12-15 DIAGNOSIS — L600 Ingrowing nail: Secondary | ICD-10-CM | POA: Insufficient documentation

## 2015-12-15 MED ORDER — METFORMIN HCL 500 MG PO TABS: 500.0000 mg | ORAL_TABLET | Freq: Two times a day (BID) | ORAL | Status: DC

## 2015-12-15 MED ORDER — LISINOPRIL 10 MG PO TABS: 5.0000 mg | ORAL_TABLET | Freq: Every day | ORAL | Status: DC

## 2015-12-15 NOTE — Assessment & Plan Note (Signed)
Pt needs to file down his toenail.

## 2015-12-15 NOTE — Assessment & Plan Note (Signed)
Add a low dose Lisinopril

## 2015-12-15 NOTE — Assessment & Plan Note (Signed)
Increase Metformin to twice a day.

## 2015-12-15 NOTE — Progress Notes (Signed)
   Subjective:    Patient ID: Micheal Barrett, male    DOB: 10-09-1961, 55 y.o.   MRN: [id number]  HPI    Review of Systems     Objective:   Physical Exam  Constitutional: He is oriented to person, place, and time.  Cardiovascular: Normal rate, regular rhythm and normal heart sounds.   Pulmonary/Chest: Effort normal and breath sounds normal.  Neurological: He is alert and oriented to person, place, and time.  Diminished sensation in toes of both feet.   Blood glucose: 133  Last saw a dentist a month ago.   Blood count normal, creatinine normal, liver function good, lipid profile pretty good, little bit of protein in urine. Microalbumin in the urine   Last A1c was 6.5.   Foot exam: at risk of developing an ingrown toenail. Pt should not soak his foot as he is a diabetic. Diminished sensation in each foot. Excellent pulses. Skin integrity in tact.     Assessment & Plan:   Patient Active Problem List   Diagnosis Date Noted  . Ingrown toenail 12/15/2015  . HTN (hypertension) 07/02/2015  . Cellulitis of hand, left 06/23/2015  . DM (diabetes mellitus) type 2, uncontrolled, with ketoacidosis ([place]) 05/28/2015  . Hyperlipidemia 03/05/2015   Micheal Barrett was seen today for hypertension, diabetes and hyperlipidemia.  Diagnoses and all orders for this visit:  Essential hypertension Comments: Add a low dose Lisinopril once a day.  Orders: -     lisinopril (PRINIVIL,ZESTRIL) 10 MG tablet; Take 0.5 tablets (5 mg total) by mouth daily.  Uncontrolled type 2 diabetes mellitus with ketoacidosis without coma, without long-term current use of insulin ([facility]) Comments: Increase Metformin to 500 mg twice a day. Get him a meter and strips. Needs to stop drinking sweet tea. Needs to use some Splenda. Needs to check sugars.  Orders: -     metFORMIN (GLUCOPHAGE) 500 MG tablet; Take 1 tablet (500 mg total) by mouth 2 (two) times daily.  Ingrown toenail Comments: Coarse file in the center.  Toenail may get infected.   Microalbuminuria Comments: Add lisinopril. Would like to check Blood Pressure in one month.   Shoulder pain, unspecified laterality Comments: needs to see ortho   Labs in mid-June: CMP, Lipids profile, TSH,  Needs ortho  Needs to be seen in Early June (before July) BP check in one month

## 2015-12-17 NOTE — Progress Notes (Signed)
Called in to [facility]

## 2015-12-17 NOTE — Progress Notes (Signed)
Called in prescription to [facility] . JGG

## 2015-12-30 ENCOUNTER — Institutional Professional Consult (permissible substitution): Payer: Self-pay | Admitting: Specialist

## 2016-01-11 ENCOUNTER — Other Ambulatory Visit: Payer: Self-pay | Admitting: Urology

## 2016-02-01 ENCOUNTER — Encounter: Payer: Self-pay | Admitting: Pharmacist

## 2016-03-15 ENCOUNTER — Other Ambulatory Visit: Payer: Self-pay

## 2016-03-22 ENCOUNTER — Ambulatory Visit: Payer: Self-pay

## 2016-07-15 ENCOUNTER — Telehealth: Payer: Self-pay | Admitting: Internal Medicine

## 2016-07-15 NOTE — Telephone Encounter (Signed)
Received referral request form pt PCP, [facility], Laneta Simmers, FNP, for left chest pressure and palpitations. LMOM.

## 2016-07-28 ENCOUNTER — Encounter: Payer: Self-pay | Admitting: Cardiology

## 2016-07-28 ENCOUNTER — Ambulatory Visit (INDEPENDENT_AMBULATORY_CARE_PROVIDER_SITE_OTHER): Payer: Medicaid Other | Admitting: Cardiology

## 2016-07-28 VITALS — BP 122/88 | HR 69 | Ht 71.0 in | Wt 223.5 lb

## 2016-07-28 DIAGNOSIS — R002 Palpitations: Secondary | ICD-10-CM

## 2016-07-28 DIAGNOSIS — E785 Hyperlipidemia, unspecified: Secondary | ICD-10-CM

## 2016-07-28 DIAGNOSIS — I10 Essential (primary) hypertension: Secondary | ICD-10-CM | POA: Diagnosis not present

## 2016-07-28 DIAGNOSIS — I4510 Unspecified right bundle-branch block: Secondary | ICD-10-CM

## 2016-07-28 DIAGNOSIS — R0789 Other chest pain: Secondary | ICD-10-CM

## 2016-07-28 DIAGNOSIS — R9431 Abnormal electrocardiogram [ECG] [EKG]: Secondary | ICD-10-CM

## 2016-07-28 NOTE — Patient Instructions (Signed)
Testing/Procedures: Your physician has recommended that you wear an event monitor. Event monitors are medical devices that record the heart's electrical activity. Doctors most often [place] these monitors to diagnose arrhythmias. Arrhythmias are problems with the speed or rhythm of the heartbeat. The monitor is a small, portable device. You can wear one while you do your normal daily activities. This is usually used to diagnose what is causing palpitations/syncope (passing out).  Your physician has requested that you have an echocardiogram. Echocardiography is a painless test that uses sound waves to create images of your heart. It provides your doctor with information about the size and shape of your heart and how well your heart's chambers and valves are working. This procedure takes approximately one hour. There are no restrictions for this procedure.  [place]  Your caregiver has ordered a Stress Test with nuclear imaging. The purpose of this test is to evaluate the blood supply to your heart muscle. This procedure is referred to as a "Non-Invasive Stress Test." This is because other than having an IV started in your vein, nothing is inserted or "invades" your body. Cardiac stress tests are done to find areas of poor blood flow to the heart by determining the extent of coronary artery disease (CAD). Some patients exercise on a treadmill, which naturally increases the blood flow to your heart, while others who are  unable to walk on a treadmill due to physical limitations have a pharmacologic/chemical stress agent called Lexiscan . This medicine will mimic walking on a treadmill by temporarily increasing your coronary blood flow.   Please note: these test may take anywhere between 2-4 hours to complete  PLEASE REPORT TO [facility] AT THE FIRST DESK WILL DIRECT YOU WHERE TO GO  Date of Procedure:_Thursday August 04, 2016 at 08:00AM___  Arrival Time for  Procedure:___Arrive at 07:45AM___  Instructions regarding medication:   __X__ : Hold diabetes medication Metformin (Glucophage) morning of procedure    PLEASE NOTIFY THE OFFICE AT LEAST 24 HOURS IN ADVANCE IF YOU ARE UNABLE TO KEEP YOUR APPOINTMENT.  [phone] AND  PLEASE NOTIFY NUCLEAR MEDICINE AT [facility] AT LEAST 24 HOURS IN ADVANCE IF YOU ARE UNABLE TO KEEP YOUR APPOINTMENT. [phone]  How to prepare for your Myoview test:  1. Do not eat or drink after midnight 2. No caffeine for 24 hours prior to test 3. No smoking 24 hours prior to test. 4. Your medication may be taken with water.  If your doctor stopped a medication because of this test, do not take that medication. 5. Ladies, please do not wear dresses.  Skirts or pants are appropriate. Please wear a short sleeve shirt. 6. No perfume, cologne or lotion. 7. Wear comfortable walking shoes. No heels!   Follow-Up: Your physician recommends that you schedule a follow-up appointment after testing with Dr. Yvone Neu.  It was a pleasure seeing you today here in the office. Please do not hesitate to give [place] a call back if you have any further questions. [place], BSN        Cardiac Event Monitoring A cardiac event monitor is a small recording device used to help detect abnormal heart rhythms (arrhythmias). The monitor is used to record heart rhythm when noticeable symptoms such as the following occur:  Fast heartbeats (palpitations), such as heart racing or fluttering.  Dizziness.  Fainting or light-headedness.  Unexplained weakness. The monitor is wired to two electrodes placed on your chest. Electrodes are flat,  sticky disks that attach to your skin. The monitor can be worn for up to 30 days. You will wear the monitor at all times, except when bathing.  HOW TO USE YOUR CARDIAC EVENT MONITOR A technician will prepare your chest for the electrode placement. The technician will show you how to place the  electrodes, how to work the monitor, and how to replace the batteries. Take time to practice using the monitor before you leave the office. Make sure you understand how to send the information from the monitor to your health care provider. This requires a telephone with a landline, not a cell phone. You need to:  Wear your monitor at all times, except when you are in water:  Do not get the monitor wet.  Take the monitor off when bathing. Do not swim or use a hot tub with it on.  Keep your skin clean. Do not put body lotion or moisturizer on your chest.  Change the electrodes daily or any time they stop sticking to your skin. You might need to use tape to keep them on.  It is possible that your skin under the electrodes could become irritated. To keep this from happening, try to put the electrodes in slightly different places on your chest. However, they must remain in the area under your left breast and in the upper right section of your chest.  Make sure the monitor is safely clipped to your clothing or in a location close to your body that your health care provider recommends.  Press the button to record when you feel symptoms of heart trouble, such as dizziness, weakness, light-headedness, palpitations, thumping, shortness of breath, unexplained weakness, or a fluttering or racing heart. The monitor is always on and records what happened slightly before you pressed the button, so do not worry about being too late to get good information.  Keep a diary of your activities, such as walking, doing chores, and taking medicine. It is especially important to note what you were doing when you pushed the button to record your symptoms. This will help your health care provider determine what might be contributing to your symptoms. The information stored in your monitor will be reviewed by your health care provider alongside your diary entries.  Send the recorded information as recommended by your health  care provider. It is important to understand that it will take some time for your health care provider to process the results.  Change the batteries as recommended by your health care provider. SEEK IMMEDIATE MEDICAL CARE IF:   You have chest pain.  You have extreme difficulty breathing or shortness of breath.  You develop a very fast heartbeat that persists.  You develop dizziness that does not go away.  You faint or constantly feel you are about to faint.   This information is not intended to replace advice given to you by your health care provider. Make sure you discuss any questions you have with your health care provider.   Document Released: 07/05/2008 Document Revised: 10/17/2014 Document Reviewed: 03/25/2013 Elsevier Interactive Patient Education [employer]. Echocardiogram An echocardiogram, or echocardiography, uses sound waves (ultrasound) to produce an image of your heart. The echocardiogram is simple, painless, obtained within a short period of time, and offers valuable information to your health care provider. The images from an echocardiogram can provide information such as:  Evidence of coronary artery disease (CAD).  Heart size.  Heart muscle function.  Heart valve function.  Aneurysm detection.  Evidence of a past heart attack.  Fluid buildup around the heart.  Heart muscle thickening.  Assess heart valve function. LET [facility] CARE PROVIDER KNOW ABOUT:  Any allergies you have.  All medicines you are taking, including vitamins, herbs, eye drops, creams, and over-the-counter medicines.  Previous problems you or members of your family have had with the use of anesthetics.  Any blood disorders you have.  Previous surgeries you have had.  Medical conditions you have.  Possibility of pregnancy, if this applies. BEFORE THE PROCEDURE  No special preparation is needed. Eat and drink normally.  PROCEDURE   In order to produce an image of  your heart, gel will be applied to your chest and a wand-like tool (transducer) will be moved over your chest. The gel will help transmit the sound waves from the transducer. The sound waves will harmlessly bounce off your heart to allow the heart images to be captured in real-time motion. These images will then be recorded.  You may need an IV to receive a medicine that improves the quality of the pictures. AFTER THE PROCEDURE You may return to your normal schedule including diet, activities, and medicines, unless your health care provider tells you otherwise.   This information is not intended to replace advice given to you by your health care provider. Make sure you discuss any questions you have with your health care provider.   Document Released: 09/23/2000 Document Revised: 10/17/2014 Document Reviewed: 06/03/2013 Elsevier Interactive Patient Education 2016 [place]. Cardiac Nuclear Scanning A cardiac nuclear scan is used to check your heart for problems, such as the following:  A portion of the heart is not getting enough blood.  Part of the heart muscle has died, which happens with a heart attack.  The heart wall is not working normally.  In this test, a radioactive dye (tracer) is injected into your bloodstream. After the tracer has traveled to your heart, a scanning device is used to measure how much of the tracer is absorbed by or distributed to various areas of your heart. LET [facility] CARE PROVIDER KNOW ABOUT:  Any allergies you have.  All medicines you are taking, including vitamins, herbs, eye drops, creams, and over-the-counter medicines.  Previous problems you or members of your family have had with the use of anesthetics.  Any blood disorders you have.  Previous surgeries you have had.  Medical conditions you have.  RISKS AND COMPLICATIONS Generally, this is a safe procedure. However, as with any procedure, problems can occur. Possible problems include:     Serious chest pain.  Rapid heartbeat.  Sensation of warmth in your chest. This usually passes quickly. BEFORE THE PROCEDURE Ask your health care provider about changing or stopping your regular medicines. PROCEDURE This procedure is usually done at a hospital and takes 2-4 hours.  An IV tube is inserted into one of your veins.  Your health care provider will inject a small amount of radioactive tracer through the tube.  You will then wait for 20-40 minutes while the tracer travels through your bloodstream.  You will lie down on an exam table so images of your heart can be taken. Images will be taken for about 15-20 minutes.  You will exercise on a treadmill or stationary bike. While you exercise, your heart activity will be monitored with an electrocardiogram (ECG), and your blood pressure will be checked.  If you are unable to exercise, you may be given a medicine to make your heart beat  faster.  When blood flow to your heart has peaked, tracer will again be injected through the IV tube.  After 20-40 minutes, you will get back on the exam table and have more images taken of your heart.  When the procedure is over, your IV tube will be removed. AFTER THE PROCEDURE  You will likely be able to leave shortly after the test. Unless your health care provider tells you otherwise, you may return to your normal schedule, including diet, activities, and medicines.  Make sure you find out how and when you will get your test results.   This information is not intended to replace advice given to you by your health care provider. Make sure you discuss any questions you have with your health care provider.   Document Released: 10/21/2004 Document Revised: 10/01/2013 Document Reviewed: 09/04/2013 Elsevier Interactive Patient Education [employer].
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
## 2016-08-03 ENCOUNTER — Telehealth: Payer: Self-pay | Admitting: Cardiology

## 2016-08-03 NOTE — Telephone Encounter (Signed)
Reviewed instructions, time, and location for stress test tomorrow with patient and he verbalized understanding with no further questions.

## 2016-08-04 ENCOUNTER — Ambulatory Visit
Admission: RE | Admit: 2016-08-04 | Discharge: 2016-08-04 | Disposition: A | Discharge status: Home / Self Care | Payer: Medicaid Other | Source: Ambulatory Visit | Attending: Cardiology | Admitting: Cardiology

## 2016-08-04 DIAGNOSIS — R002 Palpitations: Secondary | ICD-10-CM | POA: Diagnosis not present

## 2016-08-04 DIAGNOSIS — R9431 Abnormal electrocardiogram [ECG] [EKG]: Secondary | ICD-10-CM

## 2016-08-04 DIAGNOSIS — I4510 Unspecified right bundle-branch block: Secondary | ICD-10-CM | POA: Diagnosis not present

## 2016-08-04 DIAGNOSIS — I10 Essential (primary) hypertension: Secondary | ICD-10-CM

## 2016-08-04 DIAGNOSIS — E785 Hyperlipidemia, unspecified: Secondary | ICD-10-CM | POA: Insufficient documentation

## 2016-08-04 DIAGNOSIS — R0789 Other chest pain: Secondary | ICD-10-CM | POA: Diagnosis not present

## 2016-08-04 LAB — NM MYOCAR MULTI W/SPECT W/WALL MOTION / EF
CHL CUP NUCLEAR SRS: 2
CSEPED: 7 min
CSEPEDS: 45 s
CSEPHR: 87 %
Estimated workload: 8.3 METS
LV sys vol: 23 mL
LVDIAVOL: 63 mL (ref 62–150)
Peak HR: 146 {beats}/min
Rest HR: 78 {beats}/min
SDS: 3
SSS: 2
TID: 0.95

## 2016-08-04 MED ORDER — TECHNETIUM TC 99M TETROFOSMIN IV KIT
33.0000 | PACK | Freq: Once | INTRAVENOUS | Status: AC | PRN
  Administered 2016-08-04: 31.128 via INTRAVENOUS

## 2016-08-04 MED ORDER — TECHNETIUM TC 99M TETROFOSMIN IV KIT
12.0000 | PACK | Freq: Once | INTRAVENOUS | Status: AC | PRN
  Administered 2016-08-04: 12.1 via INTRAVENOUS

## 2016-08-11 ENCOUNTER — Encounter: Payer: Self-pay | Admitting: Cardiology

## 2016-08-11 ENCOUNTER — Ambulatory Visit (INDEPENDENT_AMBULATORY_CARE_PROVIDER_SITE_OTHER): Payer: Medicaid Other | Admitting: Cardiology

## 2016-08-11 VITALS — BP 112/80 | HR 76 | Ht 71.0 in | Wt 221.5 lb

## 2016-08-11 DIAGNOSIS — I4510 Unspecified right bundle-branch block: Secondary | ICD-10-CM

## 2016-08-11 DIAGNOSIS — E785 Hyperlipidemia, unspecified: Secondary | ICD-10-CM

## 2016-08-11 DIAGNOSIS — I10 Essential (primary) hypertension: Secondary | ICD-10-CM

## 2016-08-11 DIAGNOSIS — R9431 Abnormal electrocardiogram [ECG] [EKG]: Secondary | ICD-10-CM

## 2016-08-11 DIAGNOSIS — R002 Palpitations: Secondary | ICD-10-CM

## 2016-08-11 DIAGNOSIS — R0789 Other chest pain: Secondary | ICD-10-CM

## 2016-08-11 MED ORDER — AMLODIPINE BESYLATE 5 MG PO TABS: 5.0000 mg | ORAL_TABLET | Freq: Every day | ORAL | 6 refills | Status: DC

## 2016-08-11 MED ORDER — ISOSORBIDE MONONITRATE ER 30 MG PO TB24: 30.0000 mg | ORAL_TABLET | Freq: Every day | ORAL | 6 refills | Status: DC
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
## 2016-08-11 NOTE — Patient Instructions (Signed)
Medication Instructions:  Your physician has recommended you make the following change in your medication:  1. START Imdur (Isosorbide mononitrate) 30 mg once daily 2. DECREASE Amlodipine to 5 mg Once daily.    Follow-Up: Your physician recommends that you schedule a follow-up appointment after testing with Dr. Yvone Neu.   It was a pleasure seeing you today here in the office. Please do not hesitate to give [place] a call back if you have any further questions. [place], BSN

## 2016-08-15 ENCOUNTER — Other Ambulatory Visit: Payer: Self-pay | Admitting: Cardiology

## 2016-08-15 DIAGNOSIS — I4510 Unspecified right bundle-branch block: Secondary | ICD-10-CM

## 2016-08-15 DIAGNOSIS — R0789 Other chest pain: Secondary | ICD-10-CM

## 2016-08-15 DIAGNOSIS — R9431 Abnormal electrocardiogram [ECG] [EKG]: Secondary | ICD-10-CM

## 2016-08-15 DIAGNOSIS — R002 Palpitations: Secondary | ICD-10-CM

## 2016-08-24 ENCOUNTER — Ambulatory Visit: Payer: Medicaid Other | Attending: Otolaryngology

## 2016-08-24 DIAGNOSIS — R0683 Snoring: Secondary | ICD-10-CM | POA: Diagnosis not present

## 2016-08-25 ENCOUNTER — Ambulatory Visit (INDEPENDENT_AMBULATORY_CARE_PROVIDER_SITE_OTHER): Payer: Medicaid Other

## 2016-08-25 ENCOUNTER — Other Ambulatory Visit: Payer: Self-pay

## 2016-08-25 DIAGNOSIS — R9431 Abnormal electrocardiogram [ECG] [EKG]: Secondary | ICD-10-CM

## 2016-08-25 DIAGNOSIS — R0789 Other chest pain: Secondary | ICD-10-CM

## 2016-08-25 DIAGNOSIS — I10 Essential (primary) hypertension: Secondary | ICD-10-CM

## 2016-08-25 DIAGNOSIS — R002 Palpitations: Secondary | ICD-10-CM

## 2016-08-25 DIAGNOSIS — I4510 Unspecified right bundle-branch block: Secondary | ICD-10-CM

## 2016-08-25 DIAGNOSIS — E785 Hyperlipidemia, unspecified: Secondary | ICD-10-CM

## 2016-08-25 NOTE — Progress Notes (Unsigned)
[place]

## 2016-09-08 ENCOUNTER — Ambulatory Visit: Payer: Medicaid Other | Admitting: Cardiology

## 2016-09-15 ENCOUNTER — Ambulatory Visit (INDEPENDENT_AMBULATORY_CARE_PROVIDER_SITE_OTHER): Payer: Medicaid Other | Admitting: Cardiology

## 2016-09-15 ENCOUNTER — Encounter: Payer: Self-pay | Admitting: Cardiology

## 2016-09-15 VITALS — BP 108/78 | HR 75 | Resp 17 | Ht 71.0 in | Wt 221.2 lb

## 2016-09-15 DIAGNOSIS — I10 Essential (primary) hypertension: Secondary | ICD-10-CM

## 2016-09-15 DIAGNOSIS — R002 Palpitations: Secondary | ICD-10-CM

## 2016-09-15 DIAGNOSIS — E785 Hyperlipidemia, unspecified: Secondary | ICD-10-CM

## 2016-09-15 DIAGNOSIS — I4510 Unspecified right bundle-branch block: Secondary | ICD-10-CM

## 2016-09-15 NOTE — Patient Instructions (Signed)
Medication Instructions:  Let us know if you would like to switch to different medication for your palpitations.   Follow-Up: Your physician recommends that you schedule a follow-up appointment in: 4-6 months with Dr. Yvone Neu.  It was a pleasure seeing you today here in the office. Please do not hesitate to give [place] a call back if you have any further questions. [place], BSN
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
## 2017-01-27 ENCOUNTER — Other Ambulatory Visit: Payer: Self-pay | Admitting: Nurse Practitioner

## 2017-01-27 DIAGNOSIS — G43719 Chronic migraine without aura, intractable, without status migrainosus: Secondary | ICD-10-CM

## 2017-02-07 ENCOUNTER — Ambulatory Visit
Admission: RE | Admit: 2017-02-07 | Discharge: 2017-02-07 | Disposition: A | Discharge status: Home / Self Care | Payer: Medicaid Other | Source: Ambulatory Visit | Attending: Nurse Practitioner | Admitting: Nurse Practitioner

## 2017-07-07 ENCOUNTER — Emergency Department: Payer: Medicaid Other

## 2017-07-07 ENCOUNTER — Encounter: Payer: Self-pay | Admitting: Emergency Medicine

## 2017-07-07 ENCOUNTER — Emergency Department
Admission: EM | Admit: 2017-07-07 | Discharge: 2017-07-07 | Disposition: A | Discharge status: Home / Self Care | Payer: Medicaid Other | Attending: Emergency Medicine | Admitting: Emergency Medicine

## 2017-07-07 DIAGNOSIS — Z7901 Long term (current) use of anticoagulants: Secondary | ICD-10-CM | POA: Insufficient documentation

## 2017-07-07 DIAGNOSIS — Y929 Unspecified place or not applicable: Secondary | ICD-10-CM | POA: Insufficient documentation

## 2017-07-07 DIAGNOSIS — F17210 Nicotine dependence, cigarettes, uncomplicated: Secondary | ICD-10-CM | POA: Insufficient documentation

## 2017-07-07 DIAGNOSIS — S76012A Strain of muscle, fascia and tendon of left hip, initial encounter: Secondary | ICD-10-CM | POA: Insufficient documentation

## 2017-07-07 DIAGNOSIS — W010XXA Fall on same level from slipping, tripping and stumbling without subsequent striking against object, initial encounter: Secondary | ICD-10-CM | POA: Insufficient documentation

## 2017-07-07 DIAGNOSIS — I10 Essential (primary) hypertension: Secondary | ICD-10-CM | POA: Insufficient documentation

## 2017-07-07 DIAGNOSIS — Y999 Unspecified external cause status: Secondary | ICD-10-CM | POA: Insufficient documentation

## 2017-07-07 DIAGNOSIS — E119 Type 2 diabetes mellitus without complications: Secondary | ICD-10-CM | POA: Insufficient documentation

## 2017-07-07 DIAGNOSIS — M25552 Pain in left hip: Secondary | ICD-10-CM

## 2017-07-07 DIAGNOSIS — Y93K1 Activity, walking an animal: Secondary | ICD-10-CM | POA: Diagnosis not present

## 2017-07-07 DIAGNOSIS — Z79899 Other long term (current) drug therapy: Secondary | ICD-10-CM | POA: Insufficient documentation

## 2017-07-07 DIAGNOSIS — S79912A Unspecified injury of left hip, initial encounter: Secondary | ICD-10-CM | POA: Diagnosis present

## 2017-07-07 HISTORY — DX: Gout, unspecified: M10.9

## 2017-07-07 LAB — URIC ACID: Uric Acid, Serum: 6.2 mg/dL (ref 4.4–7.6)

## 2017-07-07 MED ORDER — ORPHENADRINE CITRATE 30 MG/ML IJ SOLN
60.0000 mg | INTRAMUSCULAR | Status: AC
  Administered 2017-07-07: 60 mg via INTRAMUSCULAR
  Filled 2017-07-07: qty 2

## 2017-07-07 MED ORDER — CYCLOBENZAPRINE HCL 5 MG PO TABS: 5.0000 mg | ORAL_TABLET | Freq: Three times a day (TID) | ORAL | 0 refills | Status: DC | PRN

## 2017-07-07 MED ORDER — KETOROLAC TROMETHAMINE 60 MG/2ML IM SOLN
30.0000 mg | Freq: Once | INTRAMUSCULAR | Status: AC
  Administered 2017-07-07: 30 mg via INTRAMUSCULAR
  Filled 2017-07-07: qty 2

## 2017-07-07 MED ORDER — KETOROLAC TROMETHAMINE 10 MG PO TABS
10.0000 mg | ORAL_TABLET | Freq: Three times a day (TID) | ORAL | 0 refills | Status: DC
Start: 2017-07-07 — End: 2023-05-11

## 2017-07-07 NOTE — ED Triage Notes (Signed)
Presents with hip pain  Denies any injury but states he has a hx of gout  And pain feels the same  Ambulates with slowly and with a limp
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
## 2017-07-07 NOTE — Discharge Instructions (Addendum)
Your exam and x-ray are consistent with a probable muscle strain to the left hip.  Take the prescription meds as directed. Follow-up with your provider for symptoms that persist over a week.

## 2017-10-08 ENCOUNTER — Emergency Department
Admission: EM | Admit: 2017-10-08 | Discharge: 2017-10-08 | Disposition: A | Discharge status: Home / Self Care | Payer: Medicaid Other | Attending: Student in an Organized Health Care Education/Training Program | Admitting: Student in an Organized Health Care Education/Training Program

## 2017-10-08 ENCOUNTER — Encounter: Payer: Self-pay | Admitting: Intensive Care

## 2017-10-08 ENCOUNTER — Emergency Department: Payer: Medicaid Other

## 2017-10-08 DIAGNOSIS — W010XXA Fall on same level from slipping, tripping and stumbling without subsequent striking against object, initial encounter: Secondary | ICD-10-CM | POA: Diagnosis not present

## 2017-10-08 DIAGNOSIS — Z791 Long term (current) use of non-steroidal anti-inflammatories (NSAID): Secondary | ICD-10-CM | POA: Insufficient documentation

## 2017-10-08 DIAGNOSIS — Z7984 Long term (current) use of oral hypoglycemic drugs: Secondary | ICD-10-CM | POA: Diagnosis not present

## 2017-10-08 DIAGNOSIS — I10 Essential (primary) hypertension: Secondary | ICD-10-CM | POA: Diagnosis not present

## 2017-10-08 DIAGNOSIS — Y92310 Basketball court as the place of occurrence of the external cause: Secondary | ICD-10-CM | POA: Insufficient documentation

## 2017-10-08 DIAGNOSIS — Y999 Unspecified external cause status: Secondary | ICD-10-CM | POA: Diagnosis not present

## 2017-10-08 DIAGNOSIS — Z79899 Other long term (current) drug therapy: Secondary | ICD-10-CM | POA: Diagnosis not present

## 2017-10-08 DIAGNOSIS — Y9367 Activity, basketball: Secondary | ICD-10-CM | POA: Insufficient documentation

## 2017-10-08 DIAGNOSIS — S2241XA Multiple fractures of ribs, right side, initial encounter for closed fracture: Secondary | ICD-10-CM | POA: Insufficient documentation

## 2017-10-08 DIAGNOSIS — S29001A Unspecified injury of muscle and tendon of front wall of thorax, initial encounter: Secondary | ICD-10-CM | POA: Diagnosis present

## 2017-10-08 DIAGNOSIS — F17210 Nicotine dependence, cigarettes, uncomplicated: Secondary | ICD-10-CM | POA: Diagnosis not present

## 2017-10-08 DIAGNOSIS — E119 Type 2 diabetes mellitus without complications: Secondary | ICD-10-CM | POA: Insufficient documentation

## 2017-10-08 MED ORDER — OXYCODONE-ACETAMINOPHEN 5-325 MG PO TABS: 1.0000 | ORAL_TABLET | Freq: Four times a day (QID) | ORAL | 0 refills | Status: DC | PRN

## 2017-10-08 MED ORDER — OXYCODONE-ACETAMINOPHEN 5-325 MG PO TABS
1.0000 | ORAL_TABLET | Freq: Once | ORAL | Status: AC
  Administered 2017-10-08: 1 via ORAL
  Filled 2017-10-08: qty 1
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
## 2017-10-08 NOTE — ED Triage Notes (Signed)
Patient was playing basketball yesterday and fell and now experiencing extreme R sided rib pain. Ambulatory in ER

## 2017-10-08 NOTE — Discharge Instructions (Signed)
Call and make a follow-up appointment with your  Primary care provider. Apply ice packs to your ribs.  Percocet for pain only as directed.   Use pillow against your ribs anytime you feel like you are going to cough for support.

## 2018-01-27 IMAGING — CR DG HIP (WITH OR WITHOUT PELVIS) 2-3V*L*
3 series · 3 of 3 positions shown · non-contrast
Comparison: None.

CLINICAL DATA: Left hip pain with history of gout

EXAM:
DG HIP (WITH OR WITHOUT PELVIS) 2-3V LEFT

[pelvis ap]
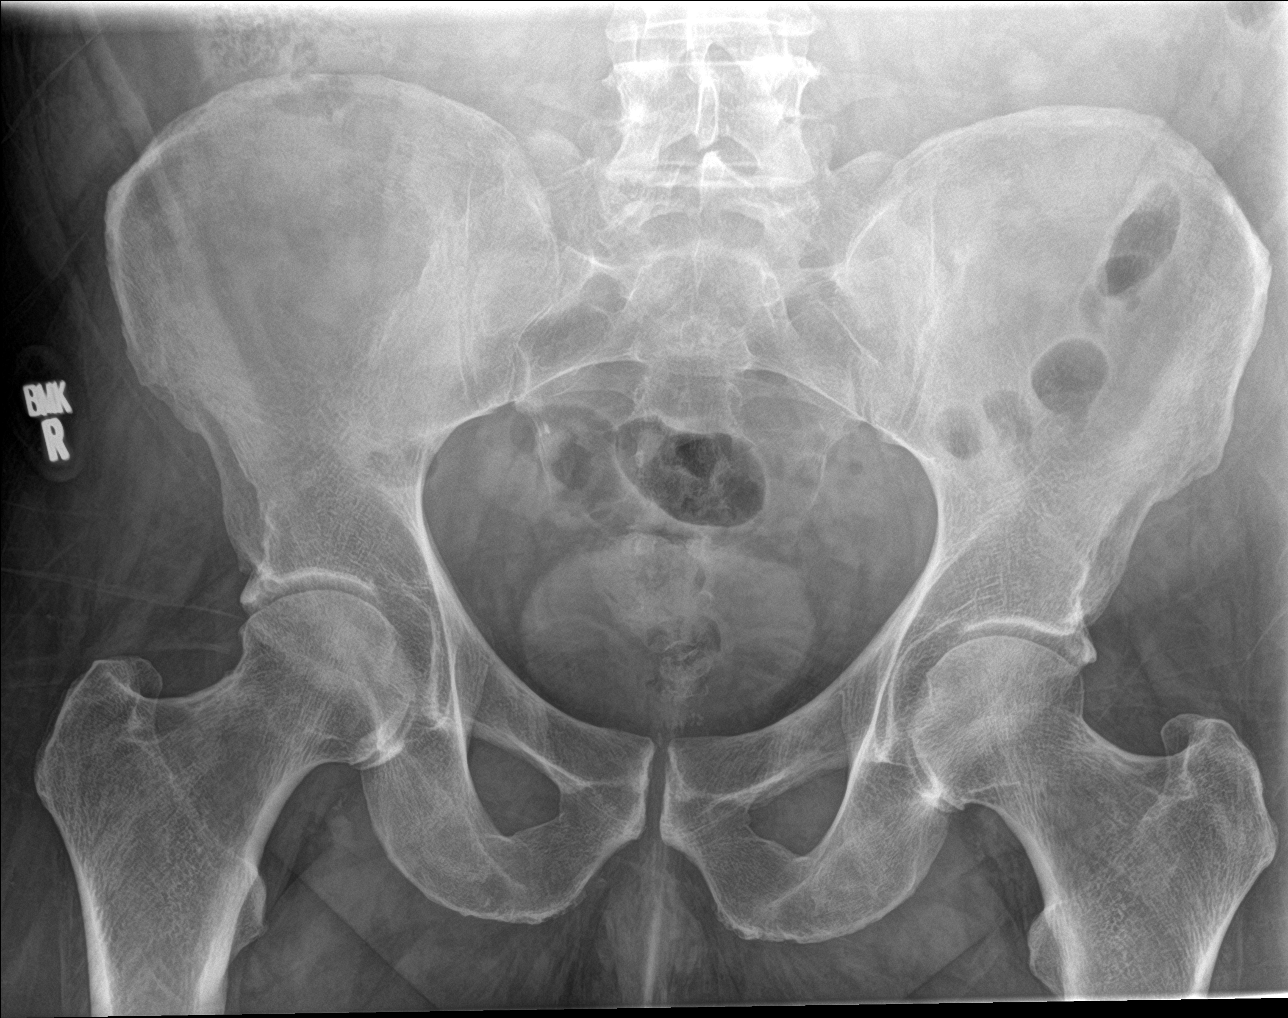

[hip ap]
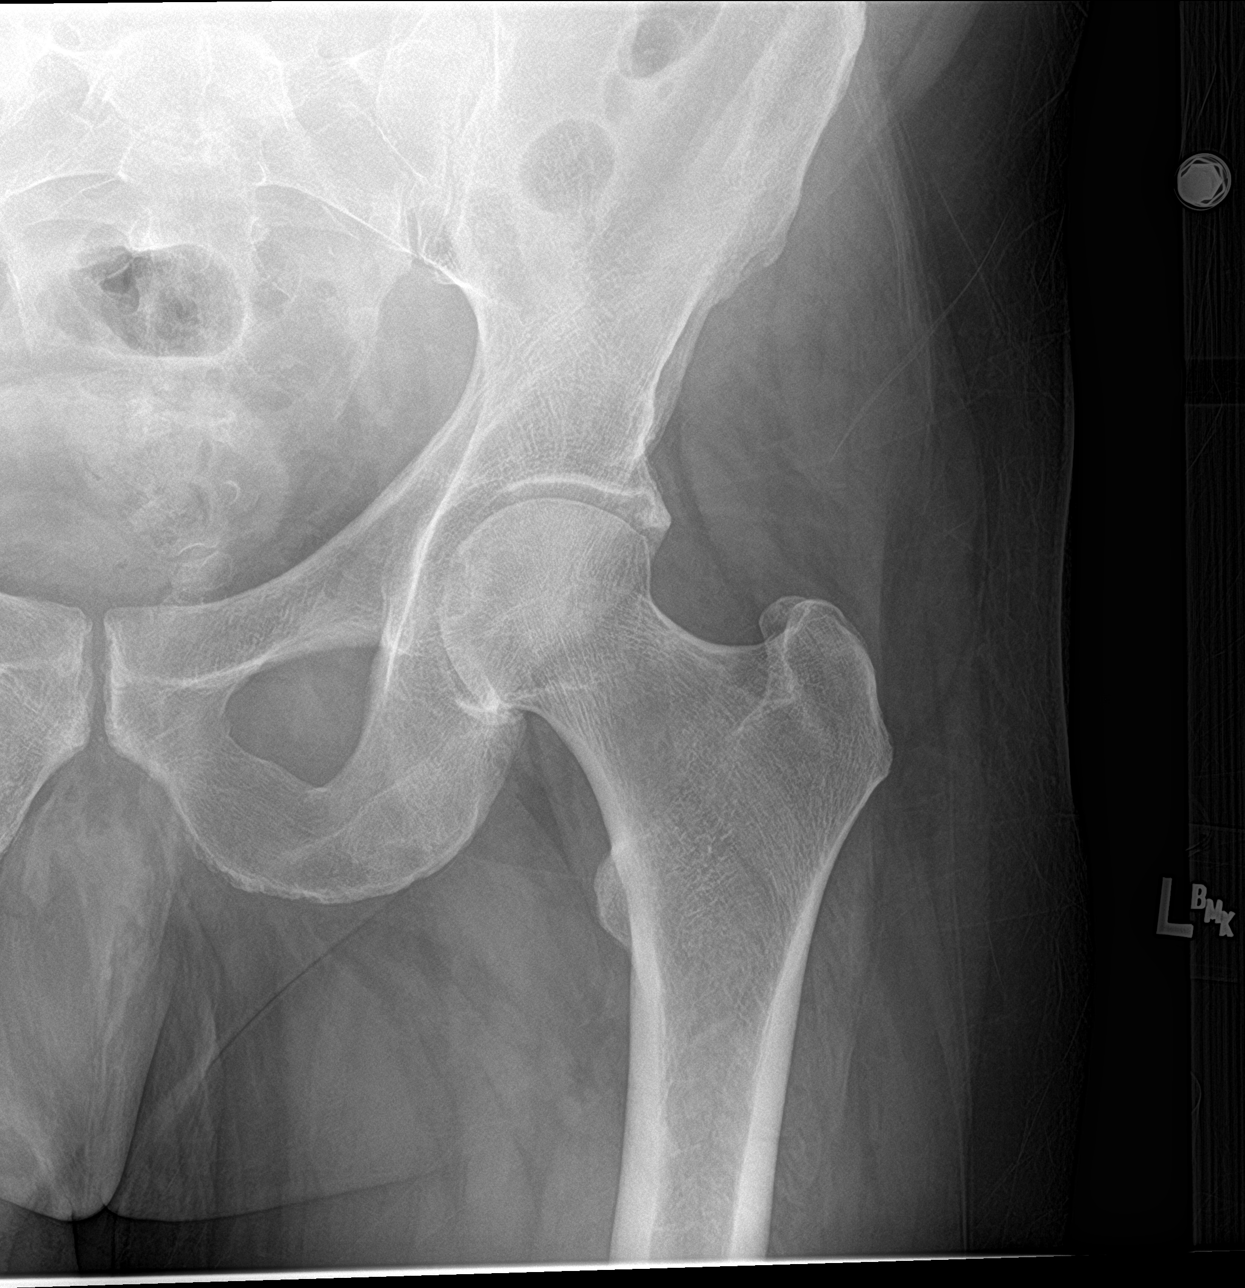

[hip lat]
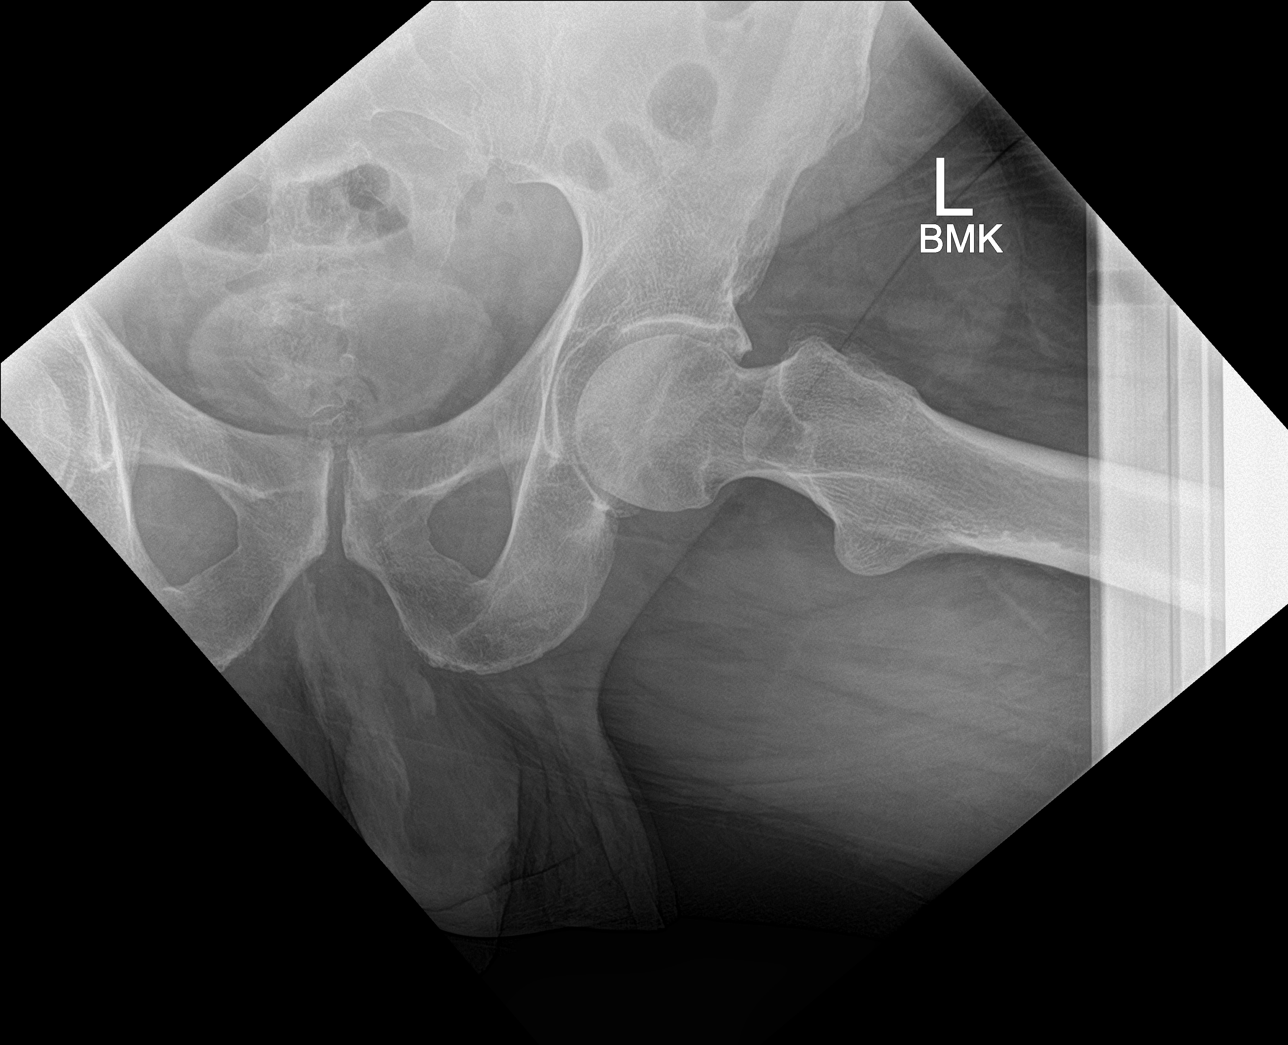

[3 of 3 positions shown; findings below may reference images not displayed]

FINDINGS: There is no evidence of hip fracture or dislocation. There is no
evidence of arthropathy or other focal bone abnormality.
IMPRESSION: Negative.

## 2018-04-15 ENCOUNTER — Emergency Department
Admission: EM | Admit: 2018-04-15 | Discharge: 2018-04-15 | Disposition: A | Discharge status: Home / Self Care | Payer: Medicaid Other | Attending: Emergency Medicine | Admitting: Emergency Medicine

## 2018-04-15 ENCOUNTER — Other Ambulatory Visit: Payer: Self-pay

## 2018-04-15 ENCOUNTER — Emergency Department: Payer: Medicaid Other

## 2018-04-15 DIAGNOSIS — Y998 Other external cause status: Secondary | ICD-10-CM | POA: Diagnosis not present

## 2018-04-15 DIAGNOSIS — I10 Essential (primary) hypertension: Secondary | ICD-10-CM | POA: Insufficient documentation

## 2018-04-15 DIAGNOSIS — W010XXA Fall on same level from slipping, tripping and stumbling without subsequent striking against object, initial encounter: Secondary | ICD-10-CM | POA: Insufficient documentation

## 2018-04-15 DIAGNOSIS — F17210 Nicotine dependence, cigarettes, uncomplicated: Secondary | ICD-10-CM | POA: Diagnosis not present

## 2018-04-15 DIAGNOSIS — Z7984 Long term (current) use of oral hypoglycemic drugs: Secondary | ICD-10-CM | POA: Insufficient documentation

## 2018-04-15 DIAGNOSIS — Y93K1 Activity, walking an animal: Secondary | ICD-10-CM | POA: Diagnosis not present

## 2018-04-15 DIAGNOSIS — S2231XA Fracture of one rib, right side, initial encounter for closed fracture: Secondary | ICD-10-CM | POA: Insufficient documentation

## 2018-04-15 DIAGNOSIS — E119 Type 2 diabetes mellitus without complications: Secondary | ICD-10-CM | POA: Insufficient documentation

## 2018-04-15 DIAGNOSIS — S299XXA Unspecified injury of thorax, initial encounter: Secondary | ICD-10-CM | POA: Diagnosis present

## 2018-04-15 DIAGNOSIS — Y929 Unspecified place or not applicable: Secondary | ICD-10-CM | POA: Insufficient documentation

## 2018-04-15 DIAGNOSIS — Z79899 Other long term (current) drug therapy: Secondary | ICD-10-CM | POA: Diagnosis not present

## 2018-04-15 MED ORDER — MELOXICAM 15 MG PO TABS: 15.0000 mg | ORAL_TABLET | Freq: Every day | ORAL | 1 refills | Status: AC

## 2018-04-15 MED ORDER — HYDROCODONE-ACETAMINOPHEN 5-325 MG PO TABS: 1.0000 | ORAL_TABLET | Freq: Four times a day (QID) | ORAL | 0 refills | Status: AC | PRN
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
## 2018-04-30 IMAGING — CR DG RIBS W/ CHEST 3+V*R*
1 series · 4 of 4 positions shown · non-contrast
Comparison: None.

CLINICAL DATA: Fall wall playing basketball with right-sided chest
pain, initial encounter

EXAM:
RIGHT RIBS AND CHEST - 3+ VIEW

[Series 1: dg ribs unilateral w/chest right · 0.14mm/px · 4 of 4 slices shown]
[im 1/4]
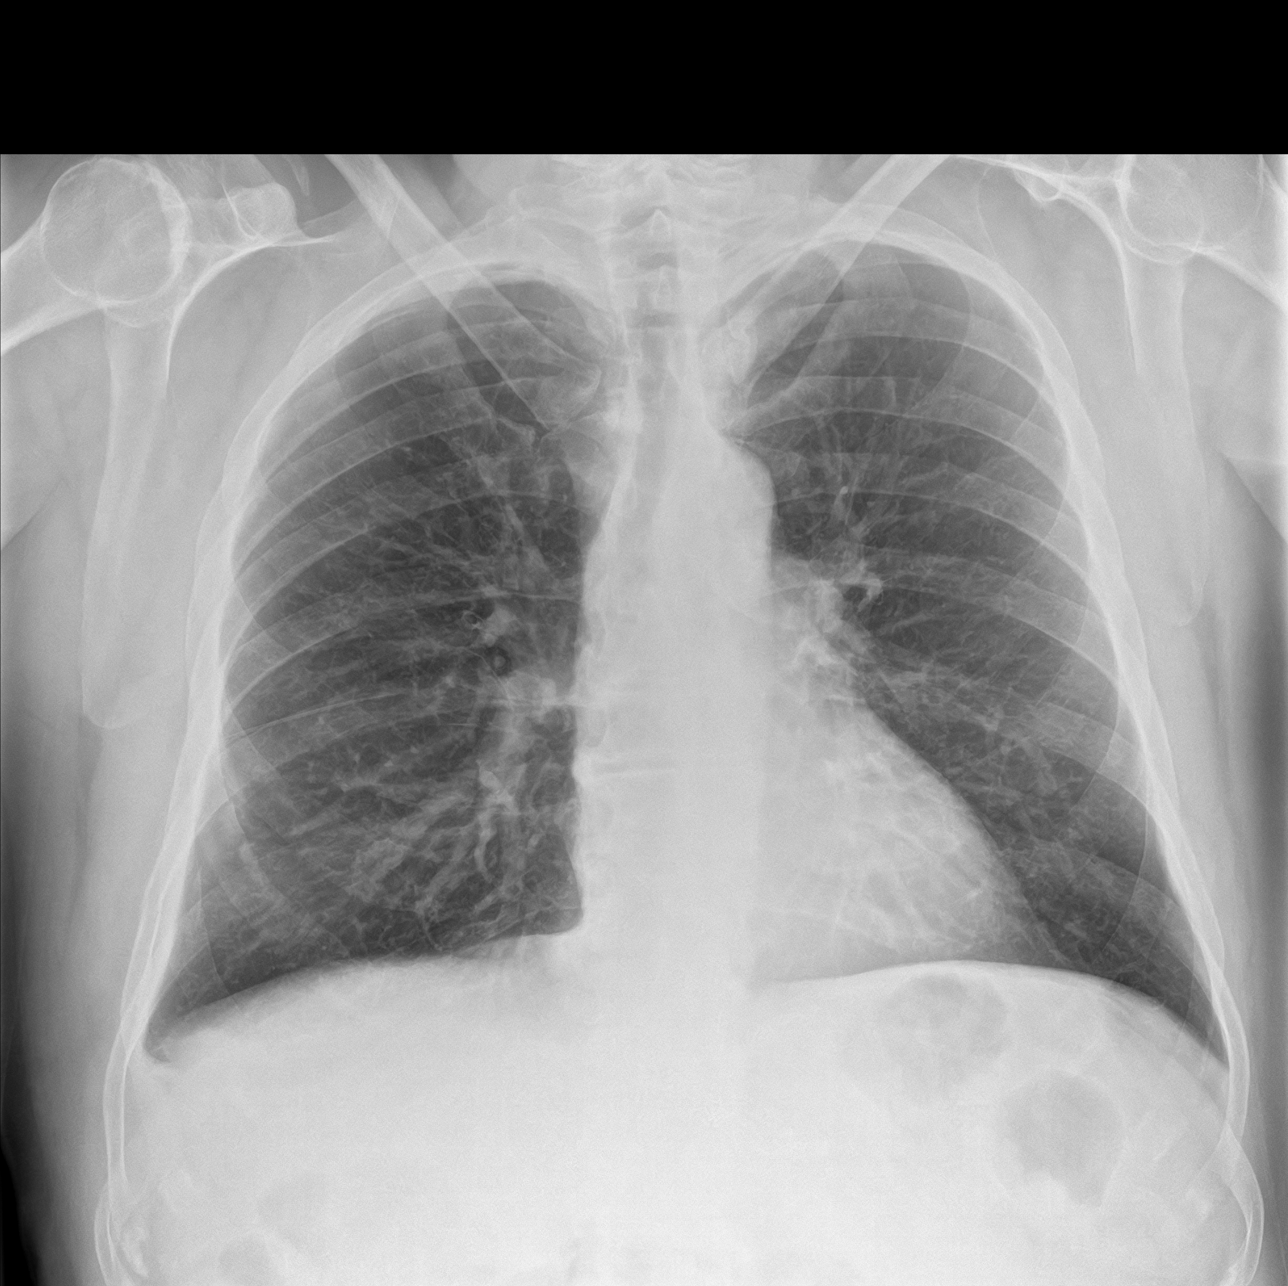
[im 2/4]
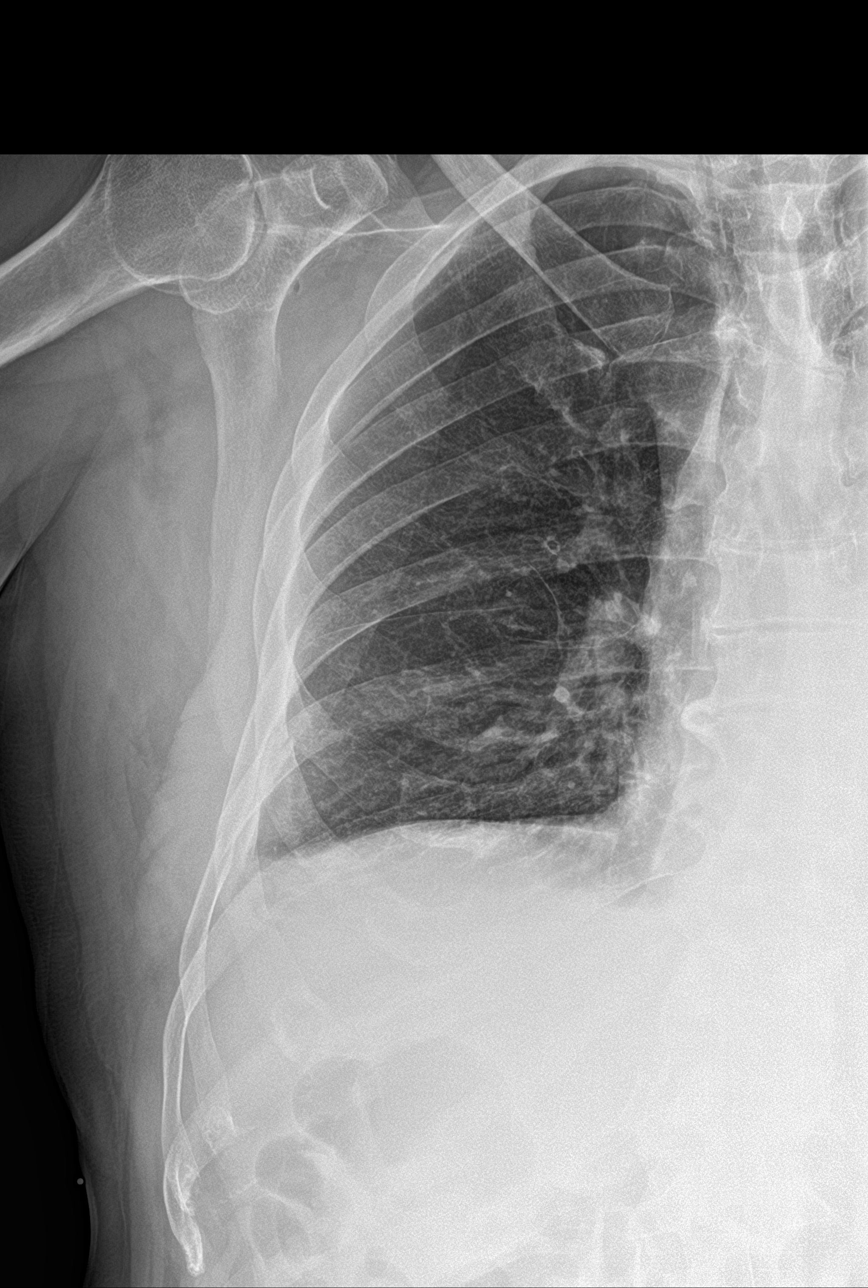
[im 3/4]
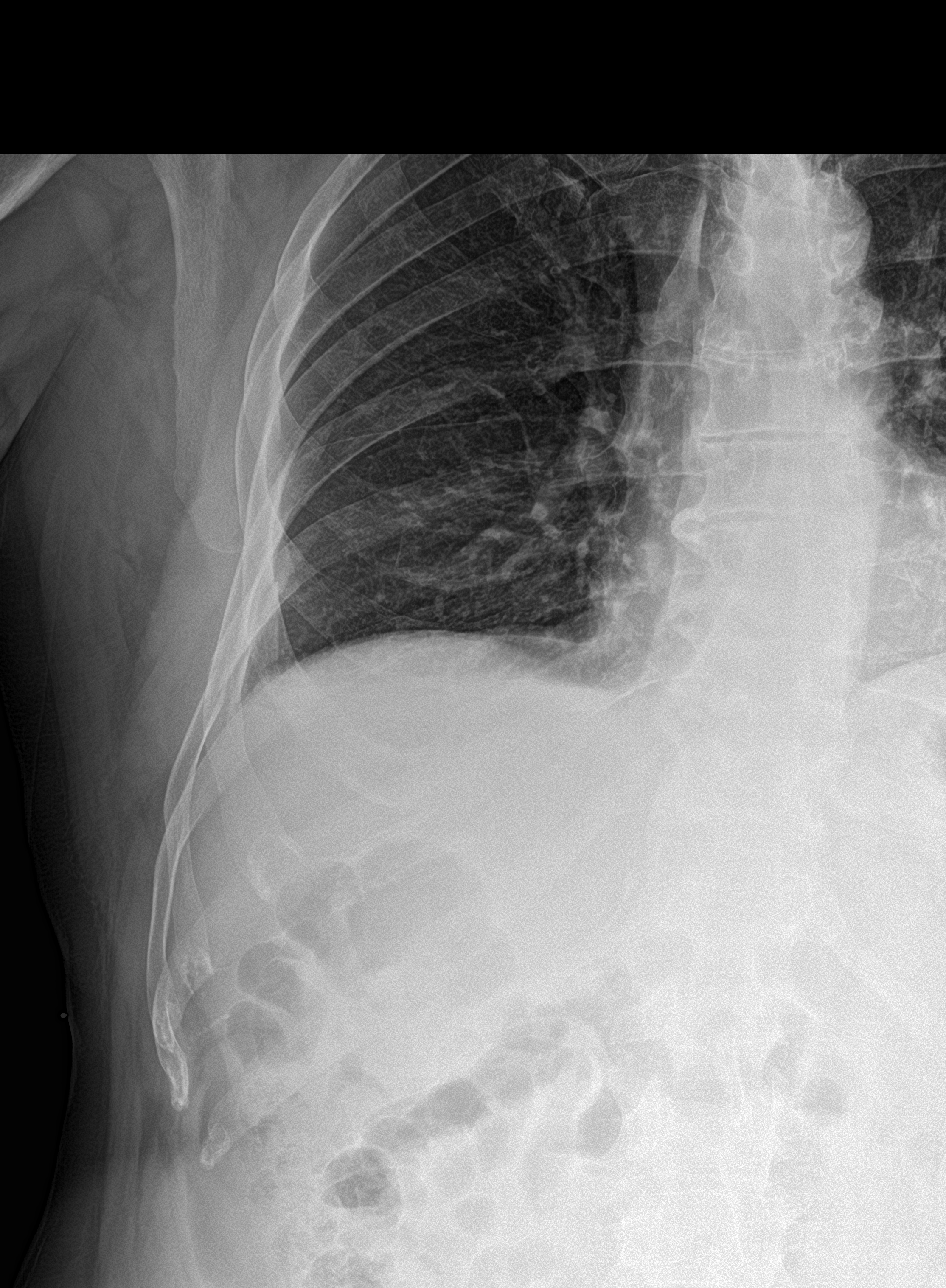
[im 4/4]
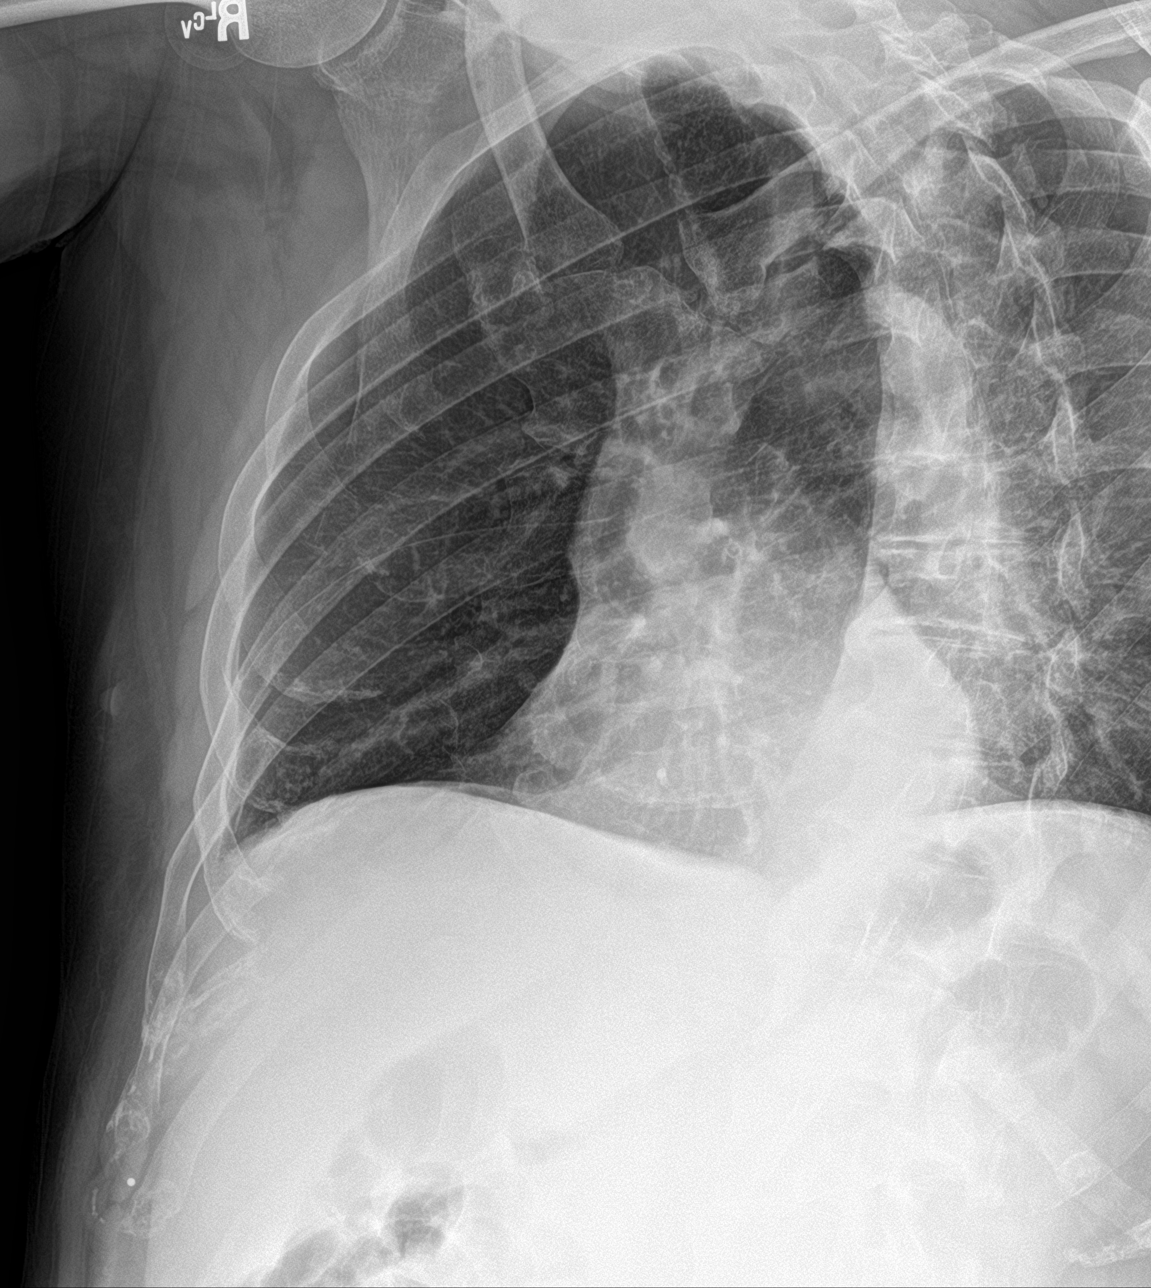

[4 of 4 positions shown; findings below may reference images not displayed]

FINDINGS: Cardiac shadow is within normal limits. The lungs are well aerated
bilaterally. No pneumothorax is seen. No effusion is noted. Mildly
displaced fractures of the sixth, seventh and eighth ribs on the
right are seen. No other focal abnormality is noted.
IMPRESSION: Fractures of the right sixth, seventh and eighth ribs without
complicating factors.

## 2018-10-07 ENCOUNTER — Emergency Department: Payer: Medicaid Other

## 2018-10-07 ENCOUNTER — Emergency Department
Admission: EM | Admit: 2018-10-07 | Discharge: 2018-10-07 | Disposition: A | Discharge status: Home / Self Care | Payer: Medicaid Other | Attending: Emergency Medicine | Admitting: Emergency Medicine

## 2018-10-07 ENCOUNTER — Encounter: Payer: Self-pay | Admitting: Emergency Medicine

## 2018-10-07 ENCOUNTER — Other Ambulatory Visit: Payer: Self-pay

## 2018-10-07 DIAGNOSIS — R69 Illness, unspecified: Secondary | ICD-10-CM

## 2018-10-07 DIAGNOSIS — Z7984 Long term (current) use of oral hypoglycemic drugs: Secondary | ICD-10-CM | POA: Diagnosis not present

## 2018-10-07 DIAGNOSIS — J111 Influenza due to unidentified influenza virus with other respiratory manifestations: Secondary | ICD-10-CM | POA: Diagnosis not present

## 2018-10-07 DIAGNOSIS — Z79899 Other long term (current) drug therapy: Secondary | ICD-10-CM | POA: Insufficient documentation

## 2018-10-07 DIAGNOSIS — L03317 Cellulitis of buttock: Secondary | ICD-10-CM | POA: Diagnosis not present

## 2018-10-07 DIAGNOSIS — I10 Essential (primary) hypertension: Secondary | ICD-10-CM | POA: Diagnosis not present

## 2018-10-07 DIAGNOSIS — F17210 Nicotine dependence, cigarettes, uncomplicated: Secondary | ICD-10-CM | POA: Insufficient documentation

## 2018-10-07 DIAGNOSIS — R509 Fever, unspecified: Secondary | ICD-10-CM | POA: Insufficient documentation

## 2018-10-07 DIAGNOSIS — R05 Cough: Secondary | ICD-10-CM | POA: Diagnosis present

## 2018-10-07 DIAGNOSIS — E119 Type 2 diabetes mellitus without complications: Secondary | ICD-10-CM | POA: Insufficient documentation

## 2018-10-07 LAB — CBC
HEMATOCRIT: 48.4 % (ref 39.0–52.0)
HEMOGLOBIN: 16.4 g/dL (ref 13.0–17.0)
MCH: 32.7 pg (ref 26.0–34.0)
MCHC: 33.9 g/dL (ref 30.0–36.0)
MCV: 96.4 fL (ref 80.0–100.0)
Platelets: 141 10*3/uL — ABNORMAL LOW (ref 150–400)
RBC: 5.02 MIL/uL (ref 4.22–5.81)
RDW: 13.2 % (ref 11.5–15.5)
WBC: 11.5 10*3/uL — ABNORMAL HIGH (ref 4.0–10.5)
nRBC: 0 % (ref 0.0–0.2)

## 2018-10-07 LAB — COMPREHENSIVE METABOLIC PANEL
ALK PHOS: 74 U/L (ref 38–126)
ALT: 26 U/L (ref 0–44)
AST: 26 U/L (ref 15–41)
Albumin: 4.4 g/dL (ref 3.5–5.0)
Anion gap: 8 (ref 5–15)
BILIRUBIN TOTAL: 1 mg/dL (ref 0.3–1.2)
BUN: 14 mg/dL (ref 6–20)
CALCIUM: 9.6 mg/dL (ref 8.9–10.3)
CO2: 26 mmol/L (ref 22–32)
CREATININE: 0.88 mg/dL (ref 0.61–1.24)
Chloride: 103 mmol/L (ref 98–111)
GFR calc non Af Amer: >60 mL/min (ref >60)
Glucose, Bld: 122 mg/dL — ABNORMAL HIGH (ref 70–99)
Potassium: 3.8 mmol/L (ref 3.5–5.1)
SODIUM: 137 mmol/L (ref 135–145)
Total Protein: 7.9 g/dL (ref 6.5–8.1)

## 2018-10-07 LAB — DIFFERENTIAL
Abs Immature Granulocytes: 0.08 10*3/uL — ABNORMAL HIGH (ref 0.00–0.07)
BASOS ABS: 0 10*3/uL (ref 0.0–0.1)
Basophils Relative: 0 %
EOS PCT: 0 %
Eosinophils Absolute: 0 10*3/uL (ref 0.0–0.5)
Immature Granulocytes: 1 %
LYMPHS ABS: 1.1 10*3/uL (ref 0.7–4.0)
LYMPHS PCT: 10 %
MONO ABS: 0.7 10*3/uL (ref 0.1–1.0)
Monocytes Relative: 6 %
Neutro Abs: 9.3 10*3/uL — ABNORMAL HIGH (ref 1.7–7.7)
Neutrophils Relative %: 83 %

## 2018-10-07 LAB — LACTIC ACID, PLASMA: LACTIC ACID, VENOUS: 1 mmol/L (ref 0.5–1.9)

## 2018-10-07 LAB — INFLUENZA PANEL BY PCR (TYPE A & B)
INFLBPCR: NEGATIVE
Influenza A By PCR: NEGATIVE

## 2018-10-07 MED ORDER — BENZONATATE 100 MG PO CAPS: 100.0000 mg | ORAL_CAPSULE | Freq: Three times a day (TID) | ORAL | 0 refills | Status: DC | PRN

## 2018-10-07 MED ORDER — CLINDAMYCIN HCL 300 MG PO CAPS: 300.0000 mg | ORAL_CAPSULE | Freq: Four times a day (QID) | ORAL | 0 refills | Status: AC

## 2018-10-07 MED ORDER — SODIUM CHLORIDE 0.9 % IV BOLUS
1000.0000 mL | Freq: Once | INTRAVENOUS | Status: AC
  Administered 2018-10-07: 1000 mL via INTRAVENOUS

## 2018-10-07 MED ORDER — FLUTICASONE PROPIONATE 50 MCG/ACT NA SUSP: 2.0000 | Freq: Every day | NASAL | 0 refills | Status: DC

## 2018-10-07 MED ORDER — ALBUTEROL SULFATE HFA 108 (90 BASE) MCG/ACT IN AERS: 2.0000 | INHALATION_SPRAY | Freq: Four times a day (QID) | RESPIRATORY_TRACT | 0 refills | Status: DC | PRN

## 2018-10-07 MED ORDER — CLINDAMYCIN HCL 300 MG PO CAPS: 300.0000 mg | ORAL_CAPSULE | Freq: Four times a day (QID) | ORAL | 0 refills | Status: DC

## 2018-10-07 MED ORDER — ACETAMINOPHEN 325 MG PO TABS
650.0000 mg | ORAL_TABLET | Freq: Once | ORAL | Status: AC
  Administered 2018-10-07: 650 mg via ORAL
  Filled 2018-10-07: qty 2

## 2018-10-07 MED ORDER — CLINDAMYCIN PHOSPHATE 300 MG/2ML IJ SOLN
600.0000 mg | Freq: Once | INTRAMUSCULAR | Status: AC
  Administered 2018-10-07: 600 mg via INTRAMUSCULAR
  Filled 2018-10-07: qty 4

## 2018-10-07 MED ORDER — BENZONATATE 100 MG PO CAPS: 100.0000 mg | ORAL_CAPSULE | Freq: Three times a day (TID) | ORAL | 0 refills | Status: AC | PRN

## 2018-10-07 NOTE — ED Notes (Signed)
Lab notified to add on diff.

## 2018-10-07 NOTE — ED Notes (Signed)
See triage note  Presents with fever and cough  Low grade fever on arrival  Also has had some body aches
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
## 2018-10-07 NOTE — Discharge Instructions (Signed)
Your symptoms are consistent with a virus, possibly still the flu.  However, your influenza test is negative.  Your chest x-ray shows some bronchitic changes, likely from your smoking.   You do have a small skin infection on your buttocks.  You have been given a prescription for antibotics for this. Please call your primary care provider in the morning for an appointment this week. Return to the emergency immediately for worsening symptoms.

## 2018-10-07 NOTE — ED Triage Notes (Signed)
Pt to ED via POV c/o cough, fever, and chills x 2 days. Pt states that cough is non productive. Pt is in NAD at this time.

## 2019-02-14 ENCOUNTER — Telehealth: Payer: Self-pay

## 2019-02-14 NOTE — Telephone Encounter (Signed)
Called patient from recall.  No answer. No VM.  This is the 3rd attempt.  Will delete recall.

## 2019-04-29 IMAGING — CR DG CHEST 2V
1 series · 2 of 2 positions shown · non-contrast
Comparison: 04/15/2018 and 10/08/2017

CLINICAL DATA: Cough and fever.

EXAM:
CHEST - 2 VIEW

[Series 1: dg chest 2 view · 0.14mm/px · 2 of 2 slices shown]
[im 1/2]
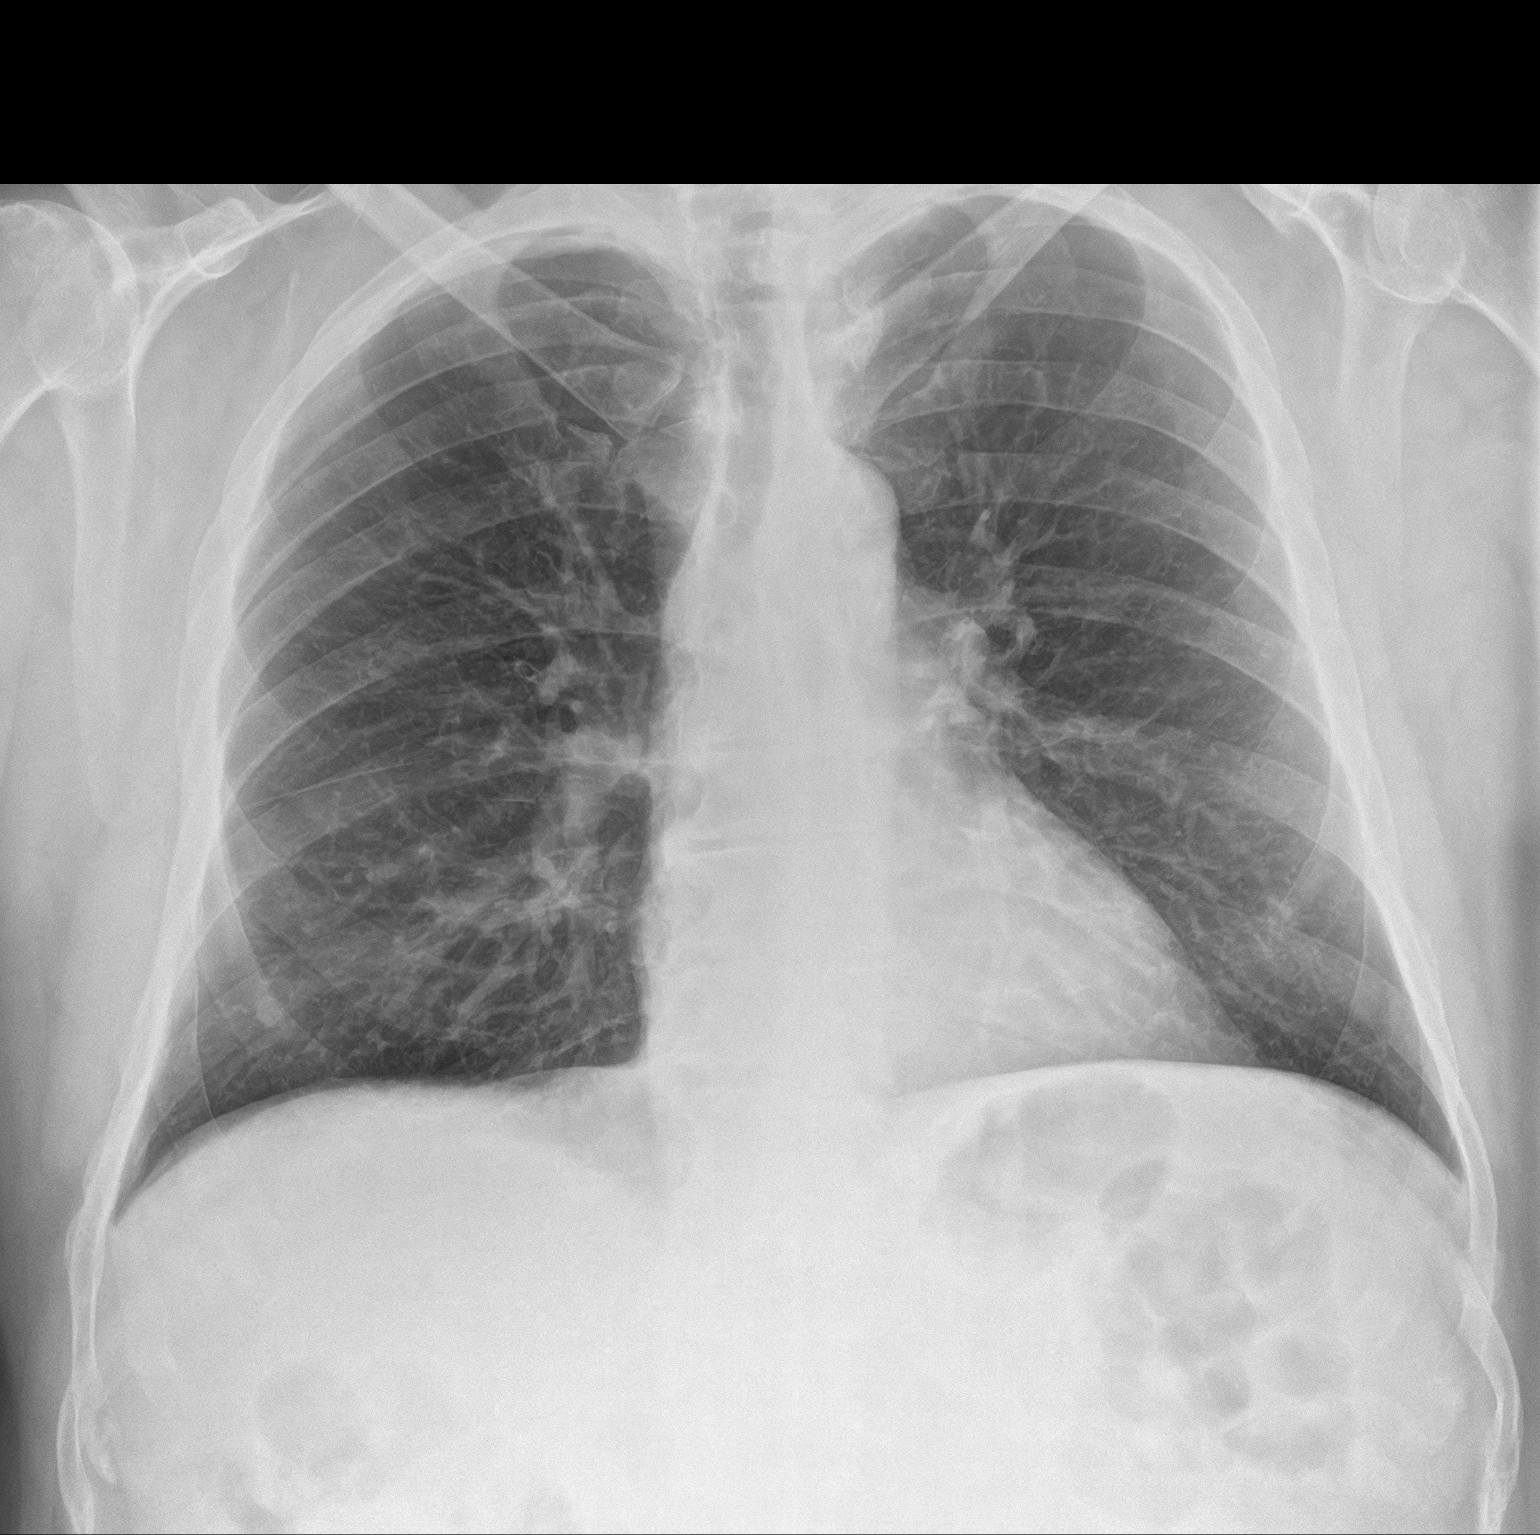
[im 2/2]
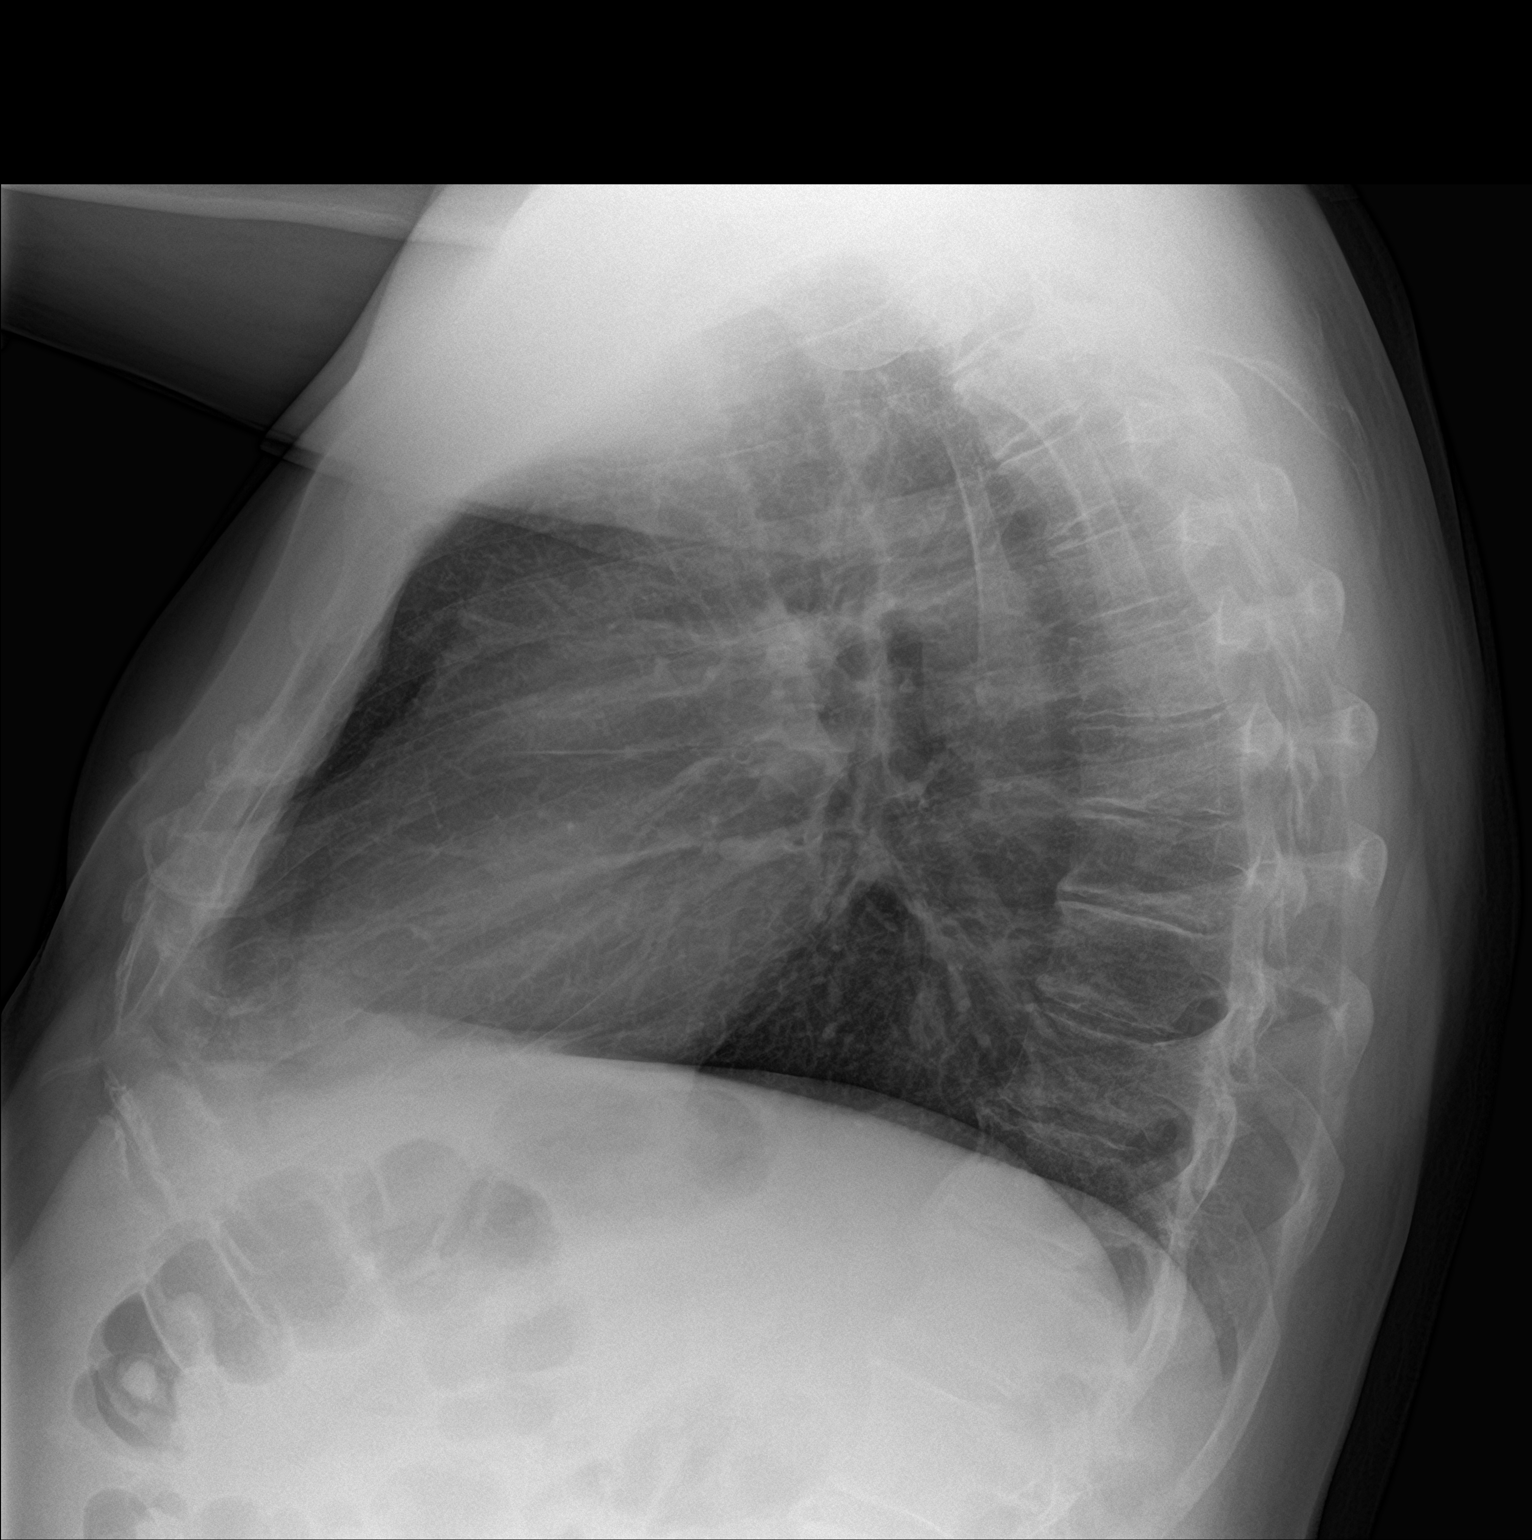

[2 of 2 positions shown; findings below may reference images not displayed]

FINDINGS: The heart size and mediastinal contours are within normal limits.
Both lungs are clear except for slight peribronchial thickening on
the lateral view. The visualized skeletal structures are
unremarkable.
IMPRESSION: Mild bronchitic changes.

## 2020-09-16 ENCOUNTER — Encounter: Payer: Self-pay | Admitting: Physician Assistant

## 2020-10-28 ENCOUNTER — Other Ambulatory Visit: Payer: Self-pay

## 2020-10-28 ENCOUNTER — Encounter: Payer: Self-pay | Admitting: Gastroenterology

## 2020-10-28 ENCOUNTER — Ambulatory Visit (INDEPENDENT_AMBULATORY_CARE_PROVIDER_SITE_OTHER): Payer: Medicaid Other | Admitting: Gastroenterology

## 2020-10-28 VITALS — BP 124/88 | HR 84 | Temp 98.1°F | Wt 216.0 lb

## 2020-10-28 DIAGNOSIS — K920 Hematemesis: Secondary | ICD-10-CM | POA: Insufficient documentation

## 2020-10-28 DIAGNOSIS — Z791 Long term (current) use of non-steroidal anti-inflammatories (NSAID): Secondary | ICD-10-CM

## 2020-10-28 DIAGNOSIS — Z1211 Encounter for screening for malignant neoplasm of colon: Secondary | ICD-10-CM | POA: Diagnosis not present

## 2020-10-28 DIAGNOSIS — F17200 Nicotine dependence, unspecified, uncomplicated: Secondary | ICD-10-CM

## 2020-10-28 MED ORDER — OMEPRAZOLE 40 MG PO CPDR: 40.0000 mg | DELAYED_RELEASE_CAPSULE | Freq: Every day | ORAL | 1 refills | Status: DC

## 2020-10-28 MED ORDER — NA SULFATE-K SULFATE-MG SULF 17.5-3.13-1.6 GM/177ML PO SOLN: 1.0000 | Freq: Once | ORAL | 0 refills | Status: AC
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
## 2020-11-10 ENCOUNTER — Other Ambulatory Visit: Payer: Self-pay

## 2020-11-10 ENCOUNTER — Other Ambulatory Visit
Admission: RE | Admit: 2020-11-10 | Discharge: 2020-11-10 | Disposition: A | Discharge status: Home / Self Care | Payer: Medicaid Other | Source: Ambulatory Visit | Attending: Gastroenterology | Admitting: Gastroenterology

## 2020-11-10 DIAGNOSIS — Z01812 Encounter for preprocedural laboratory examination: Secondary | ICD-10-CM | POA: Diagnosis present

## 2020-11-10 DIAGNOSIS — Z20822 Contact with and (suspected) exposure to covid-19: Secondary | ICD-10-CM | POA: Diagnosis not present

## 2020-11-10 LAB — SARS CORONAVIRUS 2 (TAT 6-24 HRS): SARS Coronavirus 2: NEGATIVE

## 2020-11-12 ENCOUNTER — Ambulatory Visit: Payer: Medicaid Other | Admitting: Certified Registered"

## 2020-11-12 ENCOUNTER — Encounter
Admission: RE | Disposition: A | Discharge status: Home / Self Care | Payer: Self-pay | Source: Home / Self Care | Attending: Gastroenterology

## 2020-11-12 ENCOUNTER — Ambulatory Visit
Admission: RE | Admit: 2020-11-12 | Discharge: 2020-11-12 | Disposition: A | Discharge status: Home / Self Care | Payer: Medicaid Other | Attending: Gastroenterology | Admitting: Gastroenterology

## 2020-11-12 ENCOUNTER — Other Ambulatory Visit: Payer: Self-pay

## 2020-11-12 ENCOUNTER — Encounter: Payer: Self-pay | Admitting: Gastroenterology

## 2020-11-12 DIAGNOSIS — D123 Benign neoplasm of transverse colon: Secondary | ICD-10-CM | POA: Insufficient documentation

## 2020-11-12 DIAGNOSIS — K920 Hematemesis: Secondary | ICD-10-CM | POA: Insufficient documentation

## 2020-11-12 DIAGNOSIS — K449 Diaphragmatic hernia without obstruction or gangrene: Secondary | ICD-10-CM | POA: Insufficient documentation

## 2020-11-12 DIAGNOSIS — K635 Polyp of colon: Secondary | ICD-10-CM | POA: Diagnosis not present

## 2020-11-12 DIAGNOSIS — F17210 Nicotine dependence, cigarettes, uncomplicated: Secondary | ICD-10-CM | POA: Diagnosis not present

## 2020-11-12 DIAGNOSIS — K2100 Gastro-esophageal reflux disease with esophagitis, without bleeding: Secondary | ICD-10-CM | POA: Insufficient documentation

## 2020-11-12 DIAGNOSIS — Z7984 Long term (current) use of oral hypoglycemic drugs: Secondary | ICD-10-CM | POA: Insufficient documentation

## 2020-11-12 DIAGNOSIS — D122 Benign neoplasm of ascending colon: Secondary | ICD-10-CM | POA: Diagnosis not present

## 2020-11-12 DIAGNOSIS — K2980 Duodenitis without bleeding: Secondary | ICD-10-CM | POA: Diagnosis not present

## 2020-11-12 DIAGNOSIS — Z1211 Encounter for screening for malignant neoplasm of colon: Secondary | ICD-10-CM | POA: Insufficient documentation

## 2020-11-12 DIAGNOSIS — Z7982 Long term (current) use of aspirin: Secondary | ICD-10-CM | POA: Diagnosis not present

## 2020-11-12 DIAGNOSIS — Z791 Long term (current) use of non-steroidal anti-inflammatories (NSAID): Secondary | ICD-10-CM

## 2020-11-12 HISTORY — PX: COLONOSCOPY WITH PROPOFOL: SHX5780

## 2020-11-12 HISTORY — PX: ESOPHAGOGASTRODUODENOSCOPY (EGD) WITH PROPOFOL: SHX5813

## 2020-11-12 LAB — GLUCOSE, CAPILLARY: Glucose-Capillary: 151 mg/dL — ABNORMAL HIGH (ref 70–99)

## 2020-11-12 SURGERY — COLONOSCOPY WITH PROPOFOL
Anesthesia: General

## 2020-11-12 MED ORDER — PROPOFOL 500 MG/50ML IV EMUL
INTRAVENOUS | Status: AC
  Filled 2020-11-12: qty 50

## 2020-11-12 MED ORDER — MIDAZOLAM HCL 5 MG/5ML IJ SOLN
INTRAMUSCULAR | Status: DC | PRN
  Administered 2020-11-12: 2 mg via INTRAVENOUS

## 2020-11-12 MED ORDER — PROPOFOL 10 MG/ML IV BOLUS
INTRAVENOUS | Status: DC | PRN
  Administered 2020-11-12: 30 mg via INTRAVENOUS
  Administered 2020-11-12: 70 mg via INTRAVENOUS

## 2020-11-12 MED ORDER — GLYCOPYRROLATE 0.2 MG/ML IJ SOLN
INTRAMUSCULAR | Status: AC
  Filled 2020-11-12: qty 1

## 2020-11-12 MED ORDER — SODIUM CHLORIDE 0.9 % IV SOLN: INTRAVENOUS | Status: DC

## 2020-11-12 MED ORDER — MIDAZOLAM HCL 2 MG/2ML IJ SOLN
INTRAMUSCULAR | Status: AC
  Filled 2020-11-12: qty 2

## 2020-11-12 MED ORDER — LIDOCAINE 2% (20 MG/ML) 5 ML SYRINGE
INTRAMUSCULAR | Status: DC | PRN
  Administered 2020-11-12: 25 mg via INTRAVENOUS

## 2020-11-12 MED ORDER — PROPOFOL 500 MG/50ML IV EMUL
INTRAVENOUS | Status: DC | PRN
  Administered 2020-11-12: 120 ug/kg/min via INTRAVENOUS

## 2020-11-12 MED ORDER — GLYCOPYRROLATE 0.2 MG/ML IJ SOLN
INTRAMUSCULAR | Status: DC | PRN
  Administered 2020-11-12: .2 mg via INTRAVENOUS

## 2020-11-12 NOTE — Op Note (Signed)
[facility] Gastroenterology Patient Name: Micheal Barrett Procedure Date: 11/12/2020 8:37 AM MRN: [id number] Account #: [id number] Date of Birth: 03/08/1961 Admit Type: Outpatient Age: 60 Room: [facility] ENDO ROOM 3 Gender: Male Note Status: Finalized Procedure:             Colonoscopy Indications:           Screening for colorectal malignant neoplasm Providers:             Jonathon Bellows MD, MD Referring MD:          Laneta Simmers, NP Medicines:             Monitored Anesthesia Care Complications:         No immediate complications. Procedure:             Pre-Anesthesia Assessment:                        - Prior to the procedure, a History and Physical was                         performed, and patient medications, allergies and                         sensitivities were reviewed. The patient's tolerance                         of previous anesthesia was reviewed.                        - The risks and benefits of the procedure and the                         sedation options and risks were discussed with the                         patient. All questions were answered and informed                         consent was obtained.                        - ASA Grade Assessment: II - A patient with mild                         systemic disease.                        After obtaining informed consent, the colonoscope was                         passed under direct vision. Throughout the procedure,                         the patient's blood pressure, pulse, and oxygen                         saturations were monitored continuously. The                         Colonoscope was introduced through the anus and  advanced to the the cecum, identified by the                         appendiceal orifice. The colonoscopy was performed                         with ease. The patient tolerated the procedure well.                         The quality of the bowel  preparation was good. Findings:      The perianal and digital rectal examinations were normal.      Two sessile polyps were found in the transverse colon and ascending       colon. The polyps were 8 to 10 mm in size. These polyps were removed       with a cold snare. Resection and retrieval were complete.      The exam was otherwise without abnormality on direct and retroflexion       views. Impression:            - Two 8 to 10 mm polyps in the transverse colon and in                         the ascending colon, removed with a cold snare.                         Resected and retrieved.                        - The examination was otherwise normal on direct and                         retroflexion views. Recommendation:        - Discharge patient to home (with escort).                        - Resume previous diet.                        - Continue present medications.                        - Await pathology results.                        - Repeat colonoscopy for surveillance based on                         pathology results. Procedure Code(s):     --- Professional ---                        [phone], Colonoscopy, flexible; with removal of                         tumor(s), polyp(s), or other lesion(s) by snare                         technique Diagnosis Code(s):     --- Professional ---  Z12.11, Encounter for screening for malignant neoplasm                         of colon                        K63.5, Polyp of colon CPT copyright 2019 American Medical Association. All rights reserved. The codes documented in this report are preliminary and upon coder review may  be revised to meet current compliance requirements. Jonathon Bellows, MD Jonathon Bellows MD, MD 11/12/2020 9:15:36 AM This report has been signed electronically. Number of Addenda: 0 Note Initiated On: 11/12/2020 8:37 AM Scope Withdrawal Time: 0 hours 12 minutes 58 seconds  Total Procedure Duration: 0 hours 16  minutes 22 seconds  Estimated Blood Loss:  Estimated blood loss: none.      [facility]

## 2020-11-12 NOTE — Anesthesia Postprocedure Evaluation (Signed)
Anesthesia Post Note  Patient: Micheal Barrett  Procedure(s) Performed: COLONOSCOPY WITH PROPOFOL (N/A ) ESOPHAGOGASTRODUODENOSCOPY (EGD) WITH PROPOFOL (N/A )  Patient location during evaluation: Endoscopy Anesthesia Type: General Level of consciousness: awake and alert Pain management: pain level controlled Vital Signs Assessment: post-procedure vital signs reviewed and stable Respiratory status: spontaneous breathing, nonlabored ventilation, respiratory function stable and patient connected to nasal cannula oxygen Cardiovascular status: blood pressure returned to baseline and stable Postop Assessment: no apparent nausea or vomiting Anesthetic complications: no   No complications documented.   Last Vitals:  Vitals:   11/12/20 0926 11/12/20 0928  BP: (!) 116/97 (!) 116/97  Pulse: 83 95  Resp: 20 (!) 23  Temp:    SpO2: 94% 95%    Last Pain:  Vitals:   11/12/20 0928  TempSrc:   PainSc: 0-No pain                 Martha Clan

## 2020-11-12 NOTE — Op Note (Signed)
[facility] Gastroenterology Patient Name: Micheal Barrett Procedure Date: 11/12/2020 8:38 AM MRN: [id number] Account #: [id number] Date of Birth: 08/13/1961 Admit Type: Outpatient Age: 60 Room: [facility] ENDO ROOM 3 Gender: Male Note Status: Finalized Procedure:             Upper GI endoscopy Indications:           Hematemesis Providers:             Wyline Mood MD, MD Referring MD:          Evie Lacks, NP Medicines:             Monitored Anesthesia Care Complications:         No immediate complications. Procedure:             Pre-Anesthesia Assessment:                        - Prior to the procedure, a History and Physical was                         performed, and patient medications, allergies and                         sensitivities were reviewed. The patient's tolerance                         of previous anesthesia was reviewed.                        - The risks and benefits of the procedure and the                         sedation options and risks were discussed with the                         patient. All questions were answered and informed                         consent was obtained.                        - ASA Grade Assessment: II - A patient with mild                         systemic disease.                        After obtaining informed consent, the endoscope was                         passed under direct vision. Throughout the procedure,                         the patient's blood pressure, pulse, and oxygen                         saturations were monitored continuously. The Endoscope                         was introduced through the mouth, and advanced to the  third part of duodenum. The upper GI endoscopy was                         accomplished with ease. The patient tolerated the                         procedure well. Findings:      A medium-sized hiatal hernia was present.      LA Grade A (one or more  mucosal breaks less than 5 mm, not extending       between tops of 2 mucosal folds) esophagitis with no bleeding was found       in the lower third of the esophagus.      Localized moderate inflammation characterized by congestion (edema) and       erythema was found in the duodenal bulb. Biopsies were taken with a cold       forceps for histology.      The cardia and gastric fundus were normal on retroflexion. Impression:            - Medium-sized hiatal hernia.                        - LA Grade A reflux esophagitis with no bleeding.                        - Duodenitis. Biopsied. Recommendation:        - Await pathology results.                        - Use Prilosec (omeprazole) 40 mg PO BID for 3 months.                        - Repeat upper endoscopy in 2 months to evaluate the                         response to therapy.                        - Perform a colonoscopy today. Procedure Code(s):     --- Professional ---                        [phone], Esophagogastroduodenoscopy, flexible,                         transoral; with biopsy, single or multiple Diagnosis Code(s):     --- Professional ---                        K44.9, Diaphragmatic hernia without obstruction or                         gangrene                        K21.00, Gastro-esophageal reflux disease with                         esophagitis, without bleeding                        K29.80, Duodenitis  without bleeding                        K92.0, Hematemesis CPT copyright 2019 American Medical Association. All rights reserved. The codes documented in this report are preliminary and upon coder review may  be revised to meet current compliance requirements. Jonathon Bellows, MD Jonathon Bellows MD, MD 11/12/2020 8:54:42 AM This report has been signed electronically. Number of Addenda: 0 Note Initiated On: 11/12/2020 8:38 AM Estimated Blood Loss:  Estimated blood loss: none.      [facility]

## 2020-11-12 NOTE — Transfer of Care (Signed)
Immediate Anesthesia Transfer of Care Note  Patient: Micheal Barrett  Procedure(s) Performed: COLONOSCOPY WITH PROPOFOL (N/A ) ESOPHAGOGASTRODUODENOSCOPY (EGD) WITH PROPOFOL (N/A )  Patient Location: Endoscopy Unit  Anesthesia Type:General  Level of Consciousness: awake  Airway & Oxygen Therapy: Patient Spontanous Breathing  Post-op Assessment: Report given to RN and Post -op Vital signs reviewed and stable  Post vital signs: Reviewed  Last Vitals:  Vitals Value Taken Time  BP 114/93 11/12/20 0918  Temp 36.4 C 11/12/20 0918  Pulse 88 11/12/20 0918  Resp 18 11/12/20 0918  SpO2 95 % 11/12/20 0918  Vitals shown include unvalidated device data.  Last Pain:  Vitals:   11/12/20 0918  TempSrc:   PainSc: Asleep         Complications: No complications documented.

## 2020-11-12 NOTE — Anesthesia Preprocedure Evaluation (Signed)
Anesthesia Evaluation  Patient identified by MRN, date of birth, ID band Patient awake    Reviewed: Allergy & Precautions, H&P , NPO status , Patient's Chart, lab work & pertinent test results, reviewed documented beta blocker date and time   History of Anesthesia Complications Negative for: history of anesthetic complications  Airway Mallampati: III  TM Distance: >3 FB Neck ROM: full    Dental  (+) Dental Advidsory Given, Poor Dentition, Missing   Pulmonary neg shortness of breath, neg COPD, neg recent URI, Current Smoker,    Pulmonary exam normal breath sounds clear to auscultation       Cardiovascular Exercise Tolerance: Good hypertension, (-) angina(-) Past MI and (-) Cardiac Stents Normal cardiovascular exam(-) dysrhythmias (-) Valvular Problems/Murmurs Rhythm:regular Rate:Normal     Neuro/Psych negative neurological ROS  negative psych ROS   GI/Hepatic Neg liver ROS, GERD  ,  Endo/Other  diabetes  Renal/GU negative Renal ROS  negative genitourinary   Musculoskeletal   Abdominal   Peds  Hematology negative hematology ROS (+)   Anesthesia Other Findings Past Medical History: No date: Diabetes mellitus without complication ([facility]) No date: GERD (gastroesophageal reflux disease) No date: Gout No date: Hypertension No date: Migraines   Reproductive/Obstetrics negative OB ROS                             Anesthesia Physical Anesthesia Plan  ASA: II  Anesthesia Plan: General   Post-op Pain Management:    Induction: Intravenous  PONV Risk Score and Plan: 1 and TIVA and Propofol infusion  Airway Management Planned: Natural Airway and Nasal Cannula  Additional Equipment:   Intra-op Plan:   Post-operative Plan:   Informed Consent: I have reviewed the patients History and Physical, chart, labs and discussed the procedure including the risks, benefits and alternatives for the  proposed anesthesia with the patient or authorized representative who has indicated his/her understanding and acceptance.     Dental Advisory Given  Plan Discussed with: Anesthesiologist, CRNA and Surgeon  Anesthesia Plan Comments:         Anesthesia Quick Evaluation
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
## 2020-11-13 ENCOUNTER — Encounter: Payer: Self-pay | Admitting: Gastroenterology

## 2020-11-13 LAB — SURGICAL PATHOLOGY

## 2020-11-15 ENCOUNTER — Encounter: Payer: Self-pay | Admitting: Gastroenterology

## 2020-11-21 ENCOUNTER — Other Ambulatory Visit: Payer: Self-pay | Admitting: Gastroenterology

## 2020-11-24 ENCOUNTER — Telehealth: Payer: Self-pay

## 2020-11-24 ENCOUNTER — Other Ambulatory Visit: Payer: Self-pay

## 2020-11-24 DIAGNOSIS — K920 Hematemesis: Secondary | ICD-10-CM

## 2020-11-24 NOTE — Progress Notes (Signed)
Notified patient of of Dr. Georgeann Oppenheim recommendations. Scheduled procedure and will mail instructions out.

## 2020-11-24 NOTE — Telephone Encounter (Signed)
I called patient and informed him of Dr. Georgeann Oppenheim recommendations. Pt verbalized understanding.

## 2020-11-24 NOTE — Telephone Encounter (Signed)
-----   Message from Jonathon Bellows, MD sent at 11/24/2020 11:17 AM EST ----- Regarding: RE: Bowel issues If he has not had a bowel movement since the procedure which is over 12 days back, would wait till afternoon to see if he has any response to milk of magnesium if not I would suggest him to take a bottle of magnesium citrate.  Once he takes the bottle of magnesium citrate he should have a good bowel movement.  Following which I would suggest him to take 1 capful of MiraLAX daily on a scheduled basis.  We can set him up for an office visit or a virtual visit in 2 to 3 weeks time ----- Message ----- From: Storm Frisk, CMA Sent: 11/24/2020  11:15 AM EST To: Jonathon Bellows, MD Subject: Bowel issues                                   Hello Dr. Vicente Males,  Spoke with patient and he states he is still having trouble with bowel movement since the procedure. He did say he tired laxative and Milk of magnesium and neither of them have worked yet. It has only been an hour since he has taking the milk of magnesium. Please advise.

## 2020-12-28 ENCOUNTER — Encounter: Payer: Self-pay | Admitting: *Deleted

## 2020-12-28 ENCOUNTER — Ambulatory Visit: Payer: Medicaid Other | Admitting: Gastroenterology

## 2021-01-08 ENCOUNTER — Other Ambulatory Visit: Admission: RE | Admit: 2021-01-08 | Payer: Medicaid Other | Source: Ambulatory Visit

## 2021-01-11 ENCOUNTER — Ambulatory Visit: Admission: RE | Admit: 2021-01-11 | Payer: Medicaid Other | Source: Home / Self Care | Admitting: Gastroenterology

## 2021-01-11 ENCOUNTER — Encounter: Admission: RE | Payer: Self-pay | Source: Home / Self Care

## 2021-01-11 SURGERY — ESOPHAGOGASTRODUODENOSCOPY (EGD) WITH PROPOFOL
Anesthesia: General

## 2021-11-16 ENCOUNTER — Other Ambulatory Visit: Payer: Self-pay | Admitting: Gastroenterology

## 2021-11-19 ENCOUNTER — Other Ambulatory Visit: Payer: Self-pay | Admitting: Gastroenterology

## 2021-11-22 ENCOUNTER — Other Ambulatory Visit: Payer: Self-pay

## 2021-11-22 ENCOUNTER — Emergency Department
Admission: EM | Admit: 2021-11-22 | Discharge: 2021-11-22 | Disposition: A | Discharge status: Home / Self Care | Payer: Medicaid Other | Attending: Emergency Medicine | Admitting: Emergency Medicine

## 2021-11-22 DIAGNOSIS — M25562 Pain in left knee: Secondary | ICD-10-CM | POA: Diagnosis not present

## 2021-11-22 DIAGNOSIS — M79672 Pain in left foot: Secondary | ICD-10-CM | POA: Diagnosis not present

## 2021-11-22 DIAGNOSIS — M79675 Pain in left toe(s): Secondary | ICD-10-CM | POA: Insufficient documentation

## 2021-11-22 MED ORDER — COLCHICINE 0.6 MG PO TABS
1.2000 mg | ORAL_TABLET | ORAL | Status: AC
  Administered 2021-11-22: 1.2 mg via ORAL
  Filled 2021-11-22: qty 2

## 2021-11-22 MED ORDER — PREDNISONE 10 MG PO TABS
20.0000 mg | ORAL_TABLET | Freq: Every day | ORAL | 0 refills | Status: AC
Start: 2021-11-22 — End: 2021-11-27

## 2021-11-22 MED ORDER — IBUPROFEN 400 MG PO TABS
400.0000 mg | ORAL_TABLET | Freq: Once | ORAL | Status: AC
  Administered 2021-11-22: 400 mg via ORAL
  Filled 2021-11-22: qty 1

## 2021-11-22 MED ORDER — COLCHICINE 0.6 MG PO TABS: 0.6000 mg | ORAL_TABLET | Freq: Every day | ORAL | 0 refills | Status: DC

## 2021-11-22 MED ORDER — PREDNISONE 20 MG PO TABS
20.0000 mg | ORAL_TABLET | Freq: Once | ORAL | Status: AC
  Administered 2021-11-22: 20 mg via ORAL
  Filled 2021-11-22: qty 1

## 2021-11-22 NOTE — ED Triage Notes (Signed)
Pt comes with c/o gout for two days.. pt states pain to left big toe.

## 2021-11-22 NOTE — ED Provider Notes (Signed)
[facility] Provider Note    Event Date/Time   First MD Initiated Contact with Patient 11/22/21 1041     (approximate)   History   Toe Pain   HPI  Micheal Barrett is a 61 y.o. male with a past medical history of gout, migraine headaches and reported prediabetes who presents for assessment of some nontraumatic pain in the left first toe that he states has been going on for the last 2 days but getting worse as well as some soreness on the left knee.  He denies any falls or injuries.  This feels identical to previous gout flares he has had in the past.  He took some Goody powders but no other analgesia.  Denies overlying skin changes or rashes, swelling, fevers, cough, or any other associated sick symptoms.  No other areas of pain.      Physical Exam  Triage Vital Signs: ED Triage Vitals  Enc Vitals Group     BP 11/22/21 0948 115/83     Pulse Rate 11/22/21 0948 83     Resp 11/22/21 0948 16     Temp 11/22/21 0948 98.7 F (37.1 C)     Temp Source 11/22/21 0948 Oral     SpO2 11/22/21 0948 96 %     Weight --      Height --      Head Circumference --      Peak Flow --      Pain Score 11/22/21 0928 5     Pain Loc --      Pain Edu? --      Excl. in [place]? --     Most recent vital signs: Vitals:   11/22/21 0948  BP: 115/83  Pulse: 83  Resp: 16  Temp: 98.7 F (37.1 C)  SpO2: 96%    General: Awake, no distress.  CV:  Good peripheral perfusion.  Resp:  Normal effort.  Abd:  No distention.  Other:  There is no significant edema over the left foot ankle around the left knee.  There is a little bit of edema and erythema and decreased range of motion at the distal phalangeal joint of the first toe.  No other skin changes patient is able to move all the other toes.  2+ DP pulses.  Sensations intact light touch about the foot.  There is no effusion deformity or decreased range of motion left knee.  Remainder of left lower extremity is unremarkable.   ED  Results / Procedures / Treatments  Labs (all labs ordered are listed, but only abnormal results are displayed) Labs Reviewed - No data to display   EKG    RADIOLOGY    PROCEDURES:     MEDICATIONS ORDERED IN ED: Medications  ibuprofen (ADVIL) tablet 400 mg (has no administration in time range)  colchicine tablet 1.2 mg (has no administration in time range)  predniSONE (DELTASONE) tablet 20 mg (has no administration in time range)     IMPRESSION / MDM / ASSESSMENT AND PLAN / ED COURSE  I reviewed the triage vital signs and the nursing notes.                              Differential diagnosis includes, but is not limited to gout versus pseudogout with very low suspicion at this time based on patient's history and exam for septic joint, DVT, cellulitis or occult fracture.  Discussed using colchicine and  NSAIDs and a short course of low-dose prednisone.  Patient will follow-up with his PCP.  Discussed avoiding alcohol and meats and drinking plenty of fluids.  Discussed concerns for causing hyperglycemia given the patient reports he has remote history of prediabetes within the steroids and he is adamant that the only thing that helped last time and says he will keep close track of his sugars and discontinue if they are elevated.  Will prescribe short course of low-dose prednisone.  We will also provide second dose of colchicine as he received loading dose in the ED.  He will take this at soon as he leaves emergency room about an hour after receiving initial dose.  Discharged in stable condition.  Strict return precautions advised and discussed.      FINAL CLINICAL IMPRESSION(S) / ED DIAGNOSES   Final diagnoses:  Pain of toe of left foot     Rx / DC Orders   ED Discharge Orders          Ordered    colchicine 0.6 MG tablet  Daily        11/22/21 1054    predniSONE (DELTASONE) 10 MG tablet  Daily        11/22/21 1055             Note:  This document was prepared  using Dragon voice recognition software and may include unintentional dictation errors.   Lucrezia Starch, MD 11/22/21 1055

## 2023-04-12 ENCOUNTER — Other Ambulatory Visit: Payer: Self-pay

## 2023-04-12 ENCOUNTER — Emergency Department: Payer: Medicaid Other

## 2023-04-12 ENCOUNTER — Emergency Department
Admission: EM | Admit: 2023-04-12 | Discharge: 2023-04-12 | Disposition: A | Discharge status: Home / Self Care | Payer: Medicaid Other | Attending: Emergency Medicine | Admitting: Emergency Medicine

## 2023-04-12 DIAGNOSIS — M545 Low back pain, unspecified: Secondary | ICD-10-CM | POA: Diagnosis present

## 2023-04-12 DIAGNOSIS — M25552 Pain in left hip: Secondary | ICD-10-CM | POA: Insufficient documentation

## 2023-04-12 DIAGNOSIS — G8929 Other chronic pain: Secondary | ICD-10-CM | POA: Diagnosis not present

## 2023-04-12 DIAGNOSIS — E119 Type 2 diabetes mellitus without complications: Secondary | ICD-10-CM | POA: Diagnosis not present

## 2023-04-12 DIAGNOSIS — I10 Essential (primary) hypertension: Secondary | ICD-10-CM | POA: Diagnosis not present

## 2023-04-12 DIAGNOSIS — M25551 Pain in right hip: Secondary | ICD-10-CM | POA: Insufficient documentation

## 2023-04-12 MED ORDER — IBUPROFEN 600 MG PO TABS
600.0000 mg | ORAL_TABLET | Freq: Once | ORAL | Status: AC
  Administered 2023-04-12: 600 mg via ORAL
  Filled 2023-04-12: qty 1

## 2023-04-12 MED ORDER — HYDROCODONE-ACETAMINOPHEN 5-325 MG PO TABS: 1.0000 | ORAL_TABLET | Freq: Four times a day (QID) | ORAL | 0 refills | Status: DC | PRN

## 2023-04-12 MED ORDER — PREDNISONE 20 MG PO TABS: 40.0000 mg | ORAL_TABLET | Freq: Every day | ORAL | 0 refills | Status: AC

## 2023-04-12 MED ORDER — AMLODIPINE BESYLATE 5 MG PO TABS
5.0000 mg | ORAL_TABLET | Freq: Once | ORAL | Status: AC
Start: 2023-04-12 — End: 2023-04-12
  Administered 2023-04-12: 5 mg via ORAL
  Filled 2023-04-12: qty 1

## 2023-04-12 MED ORDER — AMLODIPINE BESYLATE 5 MG PO TABS: 5.0000 mg | ORAL_TABLET | Freq: Every day | ORAL | 2 refills | Status: DC

## 2023-04-12 MED ORDER — HYDROCODONE-ACETAMINOPHEN 5-325 MG PO TABS
2.0000 | ORAL_TABLET | Freq: Once | ORAL | Status: AC
  Administered 2023-04-12: 2 via ORAL
  Filled 2023-04-12: qty 2

## 2023-04-12 NOTE — ED Notes (Signed)
Patient prescribed Amlodipine and lisinopril.  Has been out of meds and not taken in a while.

## 2023-04-12 NOTE — ED Triage Notes (Signed)
Injured back this morning while changing a tire on the truck and when lifting tire up felt back crack.  C/O left lower back and left hip pain.  Hip pain has been ongoing.

## 2023-04-12 NOTE — Discharge Instructions (Addendum)
You have been seen in the Emergency Department (ED)  today for back pain.  Your workup and exam have not shown any acute abnormalities and you are likely suffering from muscle strain or possible problems with your discs, but there is no treatment that will fix your symptoms at this time.    Take hydrocodone as prescribed for severe pain. Do not drink alcohol, drive or participate in any other potentially dangerous activities while taking this medication as it may make you sleepy. Do not take this medication with any other sedating medications, either prescription or over-the-counter. If you were prescribed Percocet or Vicodin, do not take these with acetaminophen (Tylenol) as it is already contained within these medications.   This medication is an opiate (or narcotic) pain medication and can be habit forming.  Use it as little as possible to achieve adequate pain control.  Do not use or use it with extreme caution if you have a history of opiate abuse or dependence.  If you are on a pain contract with your primary care doctor or a pain specialist, be sure to let them know you were prescribed this medication today from the [facility] Emergency Department.  This medication is intended for your use only - do not give any to anyone else and keep it in a secure place where nobody else, especially children, have access to it.  It will also cause or worsen constipation, so you may want to consider taking an over-the-counter stool softener while you are taking this medication.  Please follow up with your doctor as soon as possible regarding today's ED visit and your back pain.  Return to the ED for worsening back pain, fever, weakness or numbness of either leg, or if you develop either (1) an inability to urinate or have bowel movements, or (2) loss of your ability to control your bathroom functions (if you start having "accidents"), or if you develop other new symptoms that concern you.

## 2023-04-12 NOTE — ED Provider Notes (Signed)
[facility] Provider Note    Event Date/Time   First MD Initiated Contact with Patient 04/12/23 1428     (approximate)   History   No chief complaint on file.   HPI {Remember to add pertinent medical, surgical, social, and/or OB history to HPI:1} Micheal Barrett is a 62 y.o. male  ***       Physical Exam   Triage Vital Signs: ED Triage Vitals  Enc Vitals Group     BP 04/12/23 1336 (!) 140/112     Pulse Rate 04/12/23 1336 91     Resp 04/12/23 1336 16     Temp 04/12/23 1336 98.3 F (36.8 C)     Temp Source 04/12/23 1336 Oral     SpO2 04/12/23 1336 97 %     Weight 04/12/23 1335 119 lb 14.9 oz (54.4 kg)     Height 04/12/23 1335 5\' 11"  (1.803 m)     Head Circumference --      Peak Flow --      Pain Score 04/12/23 1335 8     Pain Loc --      Pain Edu? --      Excl. in [place]? --     Most recent vital signs: Vitals:   04/12/23 1336 04/12/23 1450  BP: (!) 140/112 (!) 141/110  Pulse: 91   Resp: 16   Temp: 98.3 F (36.8 C)   SpO2: 97%     {Only need to document appropriate and relevant physical exam:1} General: Awake, no distress. *** CV:  Good peripheral perfusion. *** Resp:  Normal effort. *** Abd:  No distention. *** Other:  ***   ED Results / Procedures / Treatments   Labs (all labs ordered are listed, but only abnormal results are displayed) Labs Reviewed - No data to display   EKG  ***   RADIOLOGY *** {USE THE WORD "INTERPRETED"!! You MUST document your own interpretation of imaging, as well as the fact that you reviewed the radiologist's report!:1}   PROCEDURES:  Critical Care performed: {CriticalCareYesNo:19197::"Yes, see critical care procedure note(s)","No"}  Procedures   MEDICATIONS ORDERED IN ED: Medications  amLODipine (NORVASC) tablet 5 mg (5 mg Oral Given 04/12/23 1450)  HYDROcodone-acetaminophen (NORCO/VICODIN) 5-325 MG per tablet 2 tablet (2 tablets Oral Given 04/12/23 1450)  ibuprofen (ADVIL) tablet  600 mg (600 mg Oral Given 04/12/23 1450)     IMPRESSION / MDM / ASSESSMENT AND PLAN / ED COURSE  I reviewed the triage vital signs and the nursing notes.                              Differential diagnosis includes, but is not limited to, ***  Patient's presentation is most consistent with {EM COPA:27473}  *** {If the patient is on the monitor, remove the brackets and asterisks on the sentence below and remember to document it as a Procedure as well. Otherwise delete the sentence below:1} {**The patient is on the cardiac monitor to evaluate for evidence of arrhythmia and/or significant heart rate changes.**} {Remember to include, when applicable, any/all of the following data: independent review of imaging independent review of labs (comment specifically on pertinent positives and negatives) review of specific prior hospitalizations, PCP/specialist notes, etc. discuss meds given and prescribed document any discussion with consultants (including hospitalists) any clinical decision tools you used and why (PECARN, NEXUS, etc.) did you consider admitting the patient? document social determinants of health affecting  patient's care (homelessness, inability to follow up in a timely fashion, etc) document any pre-existing conditions increasing risk on current visit (e.g. diabetes and HTN increasing danger of high-risk chest pain/ACS) describes what meds you gave (especially parenteral) and why any other interventions?:1}     FINAL CLINICAL IMPRESSION(S) / ED DIAGNOSES   Final diagnoses:  Acute left-sided low back pain without sciatica  Chronic hip pain, bilateral  Uncontrolled hypertension     Rx / DC Orders   ED Discharge Orders          Ordered    amLODipine (NORVASC) 5 MG tablet  Daily        04/12/23 1445    Ambulatory Referral to Primary Care (Establish Care)       Comments: Needs establish PCP. Uncontrolled hypertension.   04/12/23 1445    predniSONE (DELTASONE) 20 MG  tablet  Daily with breakfast        04/12/23 1617    HYDROcodone-acetaminophen (NORCO/VICODIN) 5-325 MG tablet  Every 6 hours PRN        04/12/23 1617             Note:  This document was prepared using Dragon voice recognition software and may include unintentional dictation errors.

## 2023-05-09 ENCOUNTER — Emergency Department: Payer: Medicaid Other

## 2023-05-09 ENCOUNTER — Encounter: Payer: Self-pay | Admitting: Emergency Medicine

## 2023-05-09 ENCOUNTER — Other Ambulatory Visit: Payer: Self-pay

## 2023-05-09 ENCOUNTER — Inpatient Hospital Stay
Admission: EM | Admit: 2023-05-09 | Discharge: 2023-05-11 | DRG: 321 | Disposition: A | Discharge status: Home / Self Care | Payer: Medicaid Other | Attending: Internal Medicine | Admitting: Internal Medicine

## 2023-05-09 ENCOUNTER — Inpatient Hospital Stay: Payer: Medicaid Other

## 2023-05-09 DIAGNOSIS — R001 Bradycardia, unspecified: Secondary | ICD-10-CM | POA: Diagnosis present

## 2023-05-09 DIAGNOSIS — M109 Gout, unspecified: Secondary | ICD-10-CM | POA: Diagnosis present

## 2023-05-09 DIAGNOSIS — I214 Non-ST elevation (NSTEMI) myocardial infarction: Principal | ICD-10-CM | POA: Diagnosis present

## 2023-05-09 DIAGNOSIS — F17210 Nicotine dependence, cigarettes, uncomplicated: Secondary | ICD-10-CM | POA: Diagnosis present

## 2023-05-09 DIAGNOSIS — I10 Essential (primary) hypertension: Secondary | ICD-10-CM | POA: Diagnosis not present

## 2023-05-09 DIAGNOSIS — Z8249 Family history of ischemic heart disease and other diseases of the circulatory system: Secondary | ICD-10-CM | POA: Diagnosis not present

## 2023-05-09 DIAGNOSIS — Z1152 Encounter for screening for COVID-19: Secondary | ICD-10-CM | POA: Diagnosis not present

## 2023-05-09 DIAGNOSIS — I119 Hypertensive heart disease without heart failure: Secondary | ICD-10-CM | POA: Diagnosis present

## 2023-05-09 DIAGNOSIS — Z818 Family history of other mental and behavioral disorders: Secondary | ICD-10-CM | POA: Diagnosis not present

## 2023-05-09 DIAGNOSIS — E785 Hyperlipidemia, unspecified: Secondary | ICD-10-CM | POA: Diagnosis present

## 2023-05-09 DIAGNOSIS — Z79899 Other long term (current) drug therapy: Secondary | ICD-10-CM

## 2023-05-09 DIAGNOSIS — E860 Dehydration: Secondary | ICD-10-CM | POA: Diagnosis present

## 2023-05-09 DIAGNOSIS — Z833 Family history of diabetes mellitus: Secondary | ICD-10-CM

## 2023-05-09 DIAGNOSIS — Z720 Tobacco use: Secondary | ICD-10-CM | POA: Diagnosis not present

## 2023-05-09 DIAGNOSIS — I4510 Unspecified right bundle-branch block: Secondary | ICD-10-CM | POA: Diagnosis present

## 2023-05-09 DIAGNOSIS — Z7982 Long term (current) use of aspirin: Secondary | ICD-10-CM

## 2023-05-09 DIAGNOSIS — Z7984 Long term (current) use of oral hypoglycemic drugs: Secondary | ICD-10-CM

## 2023-05-09 DIAGNOSIS — D751 Secondary polycythemia: Secondary | ICD-10-CM | POA: Diagnosis present

## 2023-05-09 DIAGNOSIS — J439 Emphysema, unspecified: Secondary | ICD-10-CM | POA: Diagnosis present

## 2023-05-09 DIAGNOSIS — K219 Gastro-esophageal reflux disease without esophagitis: Secondary | ICD-10-CM | POA: Diagnosis present

## 2023-05-09 DIAGNOSIS — I700 Atherosclerosis of aorta: Secondary | ICD-10-CM | POA: Diagnosis present

## 2023-05-09 DIAGNOSIS — I2584 Coronary atherosclerosis due to calcified coronary lesion: Secondary | ICD-10-CM | POA: Diagnosis present

## 2023-05-09 DIAGNOSIS — E782 Mixed hyperlipidemia: Secondary | ICD-10-CM | POA: Diagnosis not present

## 2023-05-09 DIAGNOSIS — I110 Hypertensive heart disease with heart failure: Secondary | ICD-10-CM | POA: Diagnosis present

## 2023-05-09 DIAGNOSIS — G43909 Migraine, unspecified, not intractable, without status migrainosus: Secondary | ICD-10-CM | POA: Diagnosis present

## 2023-05-09 DIAGNOSIS — I25118 Atherosclerotic heart disease of native coronary artery with other forms of angina pectoris: Secondary | ICD-10-CM | POA: Diagnosis present

## 2023-05-09 DIAGNOSIS — I255 Ischemic cardiomyopathy: Secondary | ICD-10-CM | POA: Diagnosis present

## 2023-05-09 DIAGNOSIS — R9431 Abnormal electrocardiogram [ECG] [EKG]: Secondary | ICD-10-CM | POA: Diagnosis not present

## 2023-05-09 DIAGNOSIS — I2510 Atherosclerotic heart disease of native coronary artery without angina pectoris: Secondary | ICD-10-CM | POA: Diagnosis not present

## 2023-05-09 DIAGNOSIS — I4719 Other supraventricular tachycardia: Secondary | ICD-10-CM | POA: Diagnosis present

## 2023-05-09 DIAGNOSIS — E1110 Type 2 diabetes mellitus with ketoacidosis without coma: Secondary | ICD-10-CM | POA: Diagnosis present

## 2023-05-09 DIAGNOSIS — Z823 Family history of stroke: Secondary | ICD-10-CM

## 2023-05-09 HISTORY — DX: Chest pain, unspecified: R07.9

## 2023-05-09 HISTORY — DX: Other ill-defined heart diseases: I51.89

## 2023-05-09 HISTORY — DX: Tobacco use: Z72.0

## 2023-05-09 HISTORY — DX: Unspecified right bundle-branch block: I45.10

## 2023-05-09 HISTORY — DX: Supraventricular tachycardia, unspecified: I47.10

## 2023-05-09 LAB — BASIC METABOLIC PANEL
Anion gap: 8 (ref 5–15)
BUN: 18 mg/dL (ref 6–20)
CO2: 24 mmol/L (ref 22–32)
Calcium: 9.2 mg/dL (ref 8.9–10.3)
Chloride: 106 mmol/L (ref 98–111)
Creatinine, Ser: 1.22 mg/dL (ref 0.61–1.24)
GFR, Estimated: >60 mL/min (ref >60)
Glucose, Bld: 214 mg/dL — ABNORMAL HIGH (ref 70–99)
Potassium: 4.2 mmol/L (ref 3.5–5.1)
Sodium: 138 mmol/L (ref 135–145)

## 2023-05-09 LAB — PROTIME-INR
INR: 1.1 (ref 0.8–1.2)
Prothrombin Time: 14.3 seconds (ref 11.4–15.2)

## 2023-05-09 LAB — CBC
HCT: 57.7 % — ABNORMAL HIGH (ref 39.0–52.0)
Hemoglobin: 19.8 g/dL — ABNORMAL HIGH (ref 13.0–17.0)
MCH: 32.8 pg (ref 26.0–34.0)
MCHC: 34.3 g/dL (ref 30.0–36.0)
MCV: 95.7 fL (ref 80.0–100.0)
Platelets: 233 10*3/uL (ref 150–400)
RBC: 6.03 MIL/uL — ABNORMAL HIGH (ref 4.22–5.81)
RDW: 15.4 % (ref 11.5–15.5)
WBC: 9.2 10*3/uL (ref 4.0–10.5)
nRBC: 0 % (ref 0.0–0.2)

## 2023-05-09 LAB — CBG MONITORING, ED: Glucose-Capillary: 126 mg/dL — ABNORMAL HIGH (ref 70–99)

## 2023-05-09 LAB — TROPONIN I (HIGH SENSITIVITY)
Troponin I (High Sensitivity): 436 ng/L (ref <18)
Troponin I (High Sensitivity): 511 ng/L (ref <18)

## 2023-05-09 LAB — APTT: aPTT: 25 seconds (ref 24–36)

## 2023-05-09 MED ORDER — IOHEXOL 350 MG/ML SOLN
75.0000 mL | Freq: Once | INTRAVENOUS | Status: AC | PRN
  Administered 2023-05-09: 75 mL via INTRAVENOUS

## 2023-05-09 MED ORDER — LABETALOL HCL 5 MG/ML IV SOLN: 20.0000 mg | INTRAVENOUS | Status: DC | PRN

## 2023-05-09 MED ORDER — ASPIRIN 81 MG PO TBEC
81.0000 mg | DELAYED_RELEASE_TABLET | Freq: Every day | ORAL | Status: DC
  Administered 2023-05-10 – 2023-05-11 (×2): 81 mg via ORAL
  Filled 2023-05-09 (×2): qty 1

## 2023-05-09 MED ORDER — HEPARIN BOLUS VIA INFUSION
4000.0000 [IU] | Freq: Once | INTRAVENOUS | Status: AC
  Administered 2023-05-09: 4000 [IU] via INTRAVENOUS
  Filled 2023-05-09: qty 4000

## 2023-05-09 MED ORDER — METOPROLOL SUCCINATE ER 25 MG PO TB24
25.0000 mg | ORAL_TABLET | Freq: Every day | ORAL | Status: DC
  Administered 2023-05-09 – 2023-05-10 (×2): 25 mg via ORAL
  Filled 2023-05-09 (×3): qty 1

## 2023-05-09 MED ORDER — ALBUTEROL SULFATE (2.5 MG/3ML) 0.083% IN NEBU: 2.5000 mg | INHALATION_SOLUTION | Freq: Four times a day (QID) | RESPIRATORY_TRACT | Status: DC | PRN

## 2023-05-09 MED ORDER — SODIUM CHLORIDE 0.9 % IV SOLN: INTRAVENOUS | Status: DC

## 2023-05-09 MED ORDER — POLYETHYLENE GLYCOL 3350 17 G PO PACK: 17.0000 g | PACK | Freq: Every day | ORAL | Status: DC | PRN

## 2023-05-09 MED ORDER — NITROGLYCERIN IN D5W 200-5 MCG/ML-% IV SOLN
0.0000 ug/min | INTRAVENOUS | Status: DC
  Administered 2023-05-09: 5 ug/min via INTRAVENOUS
  Filled 2023-05-09: qty 250

## 2023-05-09 MED ORDER — SODIUM CHLORIDE 0.9 % IV BOLUS
1000.0000 mL | Freq: Once | INTRAVENOUS | Status: AC
Start: 2023-05-09 — End: 2023-05-09
  Administered 2023-05-09: 1000 mL via INTRAVENOUS

## 2023-05-09 MED ORDER — NITROGLYCERIN 0.4 MG SL SUBL
0.4000 mg | SUBLINGUAL_TABLET | SUBLINGUAL | Status: DC | PRN
  Administered 2023-05-09: 0.4 mg via SUBLINGUAL
  Filled 2023-05-09: qty 1

## 2023-05-09 MED ORDER — MORPHINE SULFATE (PF) 2 MG/ML IV SOLN
2.0000 mg | INTRAVENOUS | Status: DC | PRN
  Administered 2023-05-09: 2 mg via INTRAVENOUS
  Filled 2023-05-09: qty 1

## 2023-05-09 MED ORDER — NICOTINE POLACRILEX 2 MG MT GUM: 2.0000 mg | CHEWING_GUM | OROMUCOSAL | Status: DC | PRN

## 2023-05-09 MED ORDER — ATORVASTATIN CALCIUM 80 MG PO TABS
80.0000 mg | ORAL_TABLET | Freq: Every day | ORAL | Status: DC
  Administered 2023-05-09 – 2023-05-10 (×2): 80 mg via ORAL
  Filled 2023-05-09: qty 1
  Filled 2023-05-09: qty 4

## 2023-05-09 MED ORDER — HEPARIN (PORCINE) 25000 UT/250ML-% IV SOLN
1400.0000 [IU]/h | INTRAVENOUS | Status: DC
  Administered 2023-05-09: 1250 [IU]/h via INTRAVENOUS
  Administered 2023-05-10: 1400 [IU]/h via INTRAVENOUS
  Filled 2023-05-09 (×2): qty 250

## 2023-05-09 MED ORDER — INSULIN ASPART 100 UNIT/ML IJ SOLN
0.0000 [IU] | Freq: Three times a day (TID) | INTRAMUSCULAR | Status: DC
  Administered 2023-05-10 (×2): 3 [IU] via SUBCUTANEOUS
  Filled 2023-05-09 (×2): qty 1

## 2023-05-09 MED ORDER — ASPIRIN 81 MG PO CHEW
324.0000 mg | CHEWABLE_TABLET | Freq: Once | ORAL | Status: AC
Start: 2023-05-09 — End: 2023-05-09
  Administered 2023-05-09: 324 mg via ORAL
  Filled 2023-05-09: qty 4

## 2023-05-09 MED ORDER — PANTOPRAZOLE SODIUM 40 MG PO TBEC
40.0000 mg | DELAYED_RELEASE_TABLET | Freq: Every day | ORAL | Status: DC
  Administered 2023-05-10 – 2023-05-11 (×2): 40 mg via ORAL
  Filled 2023-05-09 (×2): qty 1

## 2023-05-09 MED ORDER — FENTANYL CITRATE PF 50 MCG/ML IJ SOSY
50.0000 ug | PREFILLED_SYRINGE | Freq: Once | INTRAMUSCULAR | Status: AC
Start: 2023-05-09 — End: 2023-05-09
  Administered 2023-05-09: 50 ug via INTRAVENOUS
  Filled 2023-05-09: qty 1

## 2023-05-09 MED ORDER — ACETAMINOPHEN 650 MG RE SUPP: 650.0000 mg | Freq: Four times a day (QID) | RECTAL | Status: DC | PRN

## 2023-05-09 MED ORDER — ACETAMINOPHEN 325 MG PO TABS: 650.0000 mg | ORAL_TABLET | Freq: Four times a day (QID) | ORAL | Status: DC | PRN

## 2023-05-09 MED ORDER — NICOTINE 14 MG/24HR TD PT24
14.0000 mg | MEDICATED_PATCH | Freq: Every day | TRANSDERMAL | Status: DC
  Administered 2023-05-10: 14 mg via TRANSDERMAL
  Filled 2023-05-09: qty 1

## 2023-05-09 MED ORDER — SODIUM CHLORIDE 0.9% FLUSH
3.0000 mL | Freq: Two times a day (BID) | INTRAVENOUS | Status: DC
  Administered 2023-05-10 (×2): 3 mL via INTRAVENOUS

## 2023-05-09 MED ORDER — INSULIN ASPART 100 UNIT/ML IJ SOLN: 0.0000 [IU] | Freq: Every day | INTRAMUSCULAR | Status: DC
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
## 2023-05-09 NOTE — ED Triage Notes (Signed)
Patient to ED via POV for centralized chest pain that radiates into neck. States it started around 11am. Feels like a pressure/tight. States he feels extremely sweaty. Hx of hypertension.

## 2023-05-09 NOTE — ED Provider Notes (Signed)
[facility] Provider Note    Event Date/Time   First MD Initiated Contact with Patient 05/09/23 1821     (approximate)  History   Chief Complaint: Chest Pain  HPI  Micheal Barrett is a 62 y.o. male with a past medical history of diabetes, hypertension, presents to the emergency department for chest pain.  According to the patient around 11 AM today while he was at work he developed chest pain along with nausea diaphoresis and shortness of breath.  Patient states he was intermittent coming and going but never completely went away.  Patient states that chest pain began worsening once again just prior to arrival so the patient came to the emergency department for evaluation.  Patient denies any cardiac history denies any stents does not see a cardiologist for any reason.  Physical Exam   Triage Vital Signs: ED Triage Vitals  Encounter Vitals Group     BP 05/09/23 1716 (!) 137/102     Systolic BP Percentile --      Diastolic BP Percentile --      Pulse Rate 05/09/23 1716 93     Resp 05/09/23 1716 18     Temp 05/09/23 1716 98.3 F (36.8 C)     Temp Source 05/09/23 1716 Oral     SpO2 05/09/23 1716 99 %     Weight 05/09/23 1714 220 lb (99.8 kg)     Height 05/09/23 1714 5\' 11"  (1.803 m)     Head Circumference --      Peak Flow --      Pain Score 05/09/23 1714 4     Pain Loc --      Pain Education --      Exclude from Growth Chart --     Most recent vital signs: Vitals:   05/09/23 1716  BP: (!) 137/102  Pulse: 93  Resp: 18  Temp: 98.3 F (36.8 C)  SpO2: 99%    General: Awake, no distress.  CV:  Good peripheral perfusion.  Regular rate and rhythm  Resp:  Normal effort.  Equal breath sounds bilaterally.  Abd:  No distention.  Soft, nontender.  No rebound or guarding.  ED Results / Procedures / Treatments   EKG  EKG viewed and interpreted by myself shows a normal sinus rhythm at 92 bpm.  Patient has a right bundle branch block, widened QRS  largely normal intervals there is some nonspecific ST changes no ST elevation.  I reviewed the patient's prior EKG he also had a right bundle branch block at that time as well.  RADIOLOGY  Chest x-ray viewed and interpreted by myself shows no consolidation on my evaluation. Radiology is read the chest x-ray is negative. I reviewed the CTA I do not see any obvious early for large PE on my review of the images.  MEDICATIONS ORDERED IN ED: Medications  sodium chloride 0.9 % bolus 1,000 mL (has no administration in time range)     IMPRESSION / MDM / ASSESSMENT AND PLAN / ED COURSE  I reviewed the triage vital signs and the nursing notes.  Patient's presentation is most consistent with acute presentation with potential threat to life or bodily function.  Patient presents to the emergency department for chest pain shortness of breath nausea diaphoresis occurring since 11 AM this morning.  Patient has a concerning story for ACS versus PE.  EKG shows nonspecific findings no obvious STEMI.  Patient's chemistry is reassuring patient CBC shows significant elevation hemoglobin  19.8.  We will begin IV hydration.  Troponin is elevated to 436 we will start the patient on IV heparin.  Given the patient's acute onset of chest pain with signs of polycythemia shortness of breath somewhat pleuritic in nature we will obtain a CTA of the chest to rule out pulmonary embolism.  We will discuss with cardiology as well.  I do not see any obvious PE on the CTA on my review of the images.  Given the patient's elevated troponin likely NSTEMI I did discuss with Dr. Lalla Brothers of cardiology who recommends continuing the patient on heparin we will continue to IV hydrate given his significant hemoglobin elevation and admit to the hospital service for further workup and treatment.  Cardiology will see in the morning to decide upon catheterization.  I have ordered nitroglycerin and fentanyl for the patient's chest  pain.   CRITICAL CARE Performed by: Minna Antis   Total critical care time: 30 minutes  Critical care time was exclusive of separately billable procedures and treating other patients.  Critical care was necessary to treat or prevent imminent or life-threatening deterioration.  Critical care was time spent personally by me on the following activities: development of treatment plan with patient and/or surrogate as well as nursing, discussions with consultants, evaluation of patient's response to treatment, examination of patient, obtaining history from patient or surrogate, ordering and performing treatments and interventions, ordering and review of laboratory studies, ordering and review of radiographic studies, pulse oximetry and re-evaluation of patient's condition.   FINAL CLINICAL IMPRESSION(S) / ED DIAGNOSES   Chest pain NSTEMI  Note:  This document was prepared using Dragon voice recognition software and may include unintentional dictation errors.   Minna Antis, MD 05/09/23 [phone]

## 2023-05-09 NOTE — Assessment & Plan Note (Addendum)
Patient has already received aspirin and started on heparin infusion in the ER.  Continue with aspirin 81 mg daily.  Start with atorvastatin 80 daily and check lipid panel.  Start metoprolol 25 mg XL formulation daily.  Keep labetalol as a as needed agent for keeping SBP under 130 mmHg.  Cardiology already engaged.  See erythrocytosis below. Hold other anti HTN agents at this time.

## 2023-05-09 NOTE — ED Notes (Signed)
Blue top tube sent to lab. 

## 2023-05-09 NOTE — Progress Notes (Signed)
ANTICOAGULATION CONSULT NOTE  Pharmacy Consult for heparin infusion Indication: ACS/STEMI  No Known Allergies  Patient Measurements: Height: 5\' 11"  (180.3 cm) Weight: 99.8 kg (220 lb) IBW/kg (Calculated) : 75.3 Heparin Dosing Weight: 95.8 kg  Vital Signs: Temp: 98.3 F (36.8 C) (07/30 1716) Temp Source: Oral (07/30 1716) BP: 137/102 (07/30 1716) Pulse Rate: 93 (07/30 1716)  Labs: Recent Labs    05/09/23 1716  HGB 19.8*  HCT 57.7*  PLT 233  CREATININE 1.22  TROPONINIHS 436*    Estimated Creatinine Clearance: 77.5 mL/min (by C-G formula based on SCr of 1.22 mg/dL).   Medical History: Past Medical History:  Diagnosis Date   Diabetes mellitus without complication (HCC)    GERD (gastroesophageal reflux disease)    Gout    Hypertension    Migraines     Assessment: Pt is a 62 yo male presenting to ED radiating chest pain, found with elevated Troponin I level.  Goal of Therapy:  Heparin level 0.3-0.7 units/ml Monitor platelets by anticoagulation protocol: Yes   Plan:  Bolus 4000 units x 1 Start heparin infusion at 1250 units/hr Will check HL in 6 hr after start of infusion CBC daily while on heparin  Otelia Sergeant, PharmD, [facility] 05/09/2023 6:44 PM

## 2023-05-09 NOTE — Assessment & Plan Note (Signed)
Counseling was provided -Nicotine patch as needed

## 2023-05-09 NOTE — ED Notes (Signed)
EDP at bedside. Pt has CP since 11am today, heavy/tight/pressure. Currently 6/10.

## 2023-05-09 NOTE — Assessment & Plan Note (Signed)
Metformin was listed in his chart but apparently patient was not taking any medications. -A1c pending -Continue with SSI

## 2023-05-09 NOTE — Assessment & Plan Note (Addendum)
Likely some element of dehydration as hemoglobin improved to 17 with IV fluid. -Erythropoietin results pending -If hemoglobin remains elevated-he will get benefit from outpatient hematologic evaluation

## 2023-05-10 ENCOUNTER — Encounter
Admission: EM | Disposition: A | Discharge status: Home / Self Care | Payer: Self-pay | Source: Home / Self Care | Attending: Internal Medicine

## 2023-05-10 ENCOUNTER — Other Ambulatory Visit: Payer: Self-pay

## 2023-05-10 ENCOUNTER — Inpatient Hospital Stay (HOSPITAL_COMMUNITY)
Admit: 2023-05-10 | Discharge: 2023-05-10 | Disposition: A | Discharge status: Home / Self Care | Payer: Medicaid Other | Attending: Cardiovascular Disease | Admitting: Cardiovascular Disease

## 2023-05-10 ENCOUNTER — Other Ambulatory Visit (HOSPITAL_COMMUNITY): Payer: Self-pay

## 2023-05-10 ENCOUNTER — Inpatient Hospital Stay: Admit: 2023-05-10 | Payer: Medicaid Other

## 2023-05-10 ENCOUNTER — Encounter: Payer: Self-pay | Admitting: Internal Medicine

## 2023-05-10 DIAGNOSIS — I10 Essential (primary) hypertension: Secondary | ICD-10-CM

## 2023-05-10 DIAGNOSIS — E785 Hyperlipidemia, unspecified: Secondary | ICD-10-CM | POA: Diagnosis not present

## 2023-05-10 DIAGNOSIS — D751 Secondary polycythemia: Secondary | ICD-10-CM

## 2023-05-10 DIAGNOSIS — I2510 Atherosclerotic heart disease of native coronary artery without angina pectoris: Secondary | ICD-10-CM | POA: Diagnosis not present

## 2023-05-10 DIAGNOSIS — Z720 Tobacco use: Secondary | ICD-10-CM

## 2023-05-10 DIAGNOSIS — I214 Non-ST elevation (NSTEMI) myocardial infarction: Secondary | ICD-10-CM | POA: Diagnosis not present

## 2023-05-10 DIAGNOSIS — E1110 Type 2 diabetes mellitus with ketoacidosis without coma: Secondary | ICD-10-CM | POA: Diagnosis not present

## 2023-05-10 DIAGNOSIS — R9431 Abnormal electrocardiogram [ECG] [EKG]: Secondary | ICD-10-CM | POA: Diagnosis not present

## 2023-05-10 HISTORY — PX: LEFT HEART CATH AND CORONARY ANGIOGRAPHY: CATH118249

## 2023-05-10 HISTORY — PX: CORONARY STENT INTERVENTION: CATH118234

## 2023-05-10 HISTORY — PX: CORONARY IMAGING/OCT: CATH118326

## 2023-05-10 LAB — ECHOCARDIOGRAM COMPLETE
Height: 71 in
Weight: 3276.92 oz

## 2023-05-10 LAB — CBC
HCT: 51.3 % (ref 39.0–52.0)
Hemoglobin: 17.4 g/dL — ABNORMAL HIGH (ref 13.0–17.0)
MCH: 32.3 pg (ref 26.0–34.0)
MCHC: 33.9 g/dL (ref 30.0–36.0)
MCV: 95.4 fL (ref 80.0–100.0)
Platelets: 161 10*3/uL (ref 150–400)
RBC: 5.38 MIL/uL (ref 4.22–5.81)
RDW: 15.4 % (ref 11.5–15.5)
WBC: 7.5 10*3/uL (ref 4.0–10.5)
nRBC: 0 % (ref 0.0–0.2)

## 2023-05-10 LAB — TROPONIN I (HIGH SENSITIVITY)
Troponin I (High Sensitivity): 118 ng/L (ref <18)
Troponin I (High Sensitivity): 90 ng/L — ABNORMAL HIGH (ref <18)

## 2023-05-10 LAB — GLUCOSE, CAPILLARY
Glucose-Capillary: 196 mg/dL — ABNORMAL HIGH (ref 70–99)
Glucose-Capillary: 109 mg/dL — ABNORMAL HIGH (ref 70–99)
Glucose-Capillary: 200 mg/dL — ABNORMAL HIGH (ref 70–99)
Glucose-Capillary: 120 mg/dL — ABNORMAL HIGH (ref 70–99)

## 2023-05-10 LAB — POCT ACTIVATED CLOTTING TIME
Activated Clotting Time: 336 seconds
Activated Clotting Time: 366 seconds

## 2023-05-10 LAB — HEPARIN LEVEL (UNFRACTIONATED): Heparin Unfractionated: 0.24 IU/mL — ABNORMAL LOW (ref 0.30–0.70)

## 2023-05-10 LAB — CREATININE, SERUM
Creatinine, Ser: 0.84 mg/dL (ref 0.61–1.24)
GFR, Estimated: >60 mL/min (ref >60)

## 2023-05-10 LAB — HIV ANTIBODY (ROUTINE TESTING W REFLEX): HIV Screen 4th Generation wRfx: NONREACTIVE

## 2023-05-10 SURGERY — LEFT HEART CATH AND CORONARY ANGIOGRAPHY
Anesthesia: Moderate Sedation

## 2023-05-10 MED ORDER — SODIUM CHLORIDE 0.9% FLUSH
3.0000 mL | Freq: Two times a day (BID) | INTRAVENOUS | Status: DC
  Administered 2023-05-10: 3 mL via INTRAVENOUS

## 2023-05-10 MED ORDER — TICAGRELOR 90 MG PO TABS
90.0000 mg | ORAL_TABLET | Freq: Two times a day (BID) | ORAL | Status: DC
  Administered 2023-05-10 – 2023-05-11 (×2): 90 mg via ORAL
  Filled 2023-05-10 (×2): qty 1

## 2023-05-10 MED ORDER — HEPARIN SODIUM (PORCINE) 1000 UNIT/ML IJ SOLN
INTRAMUSCULAR | Status: DC | PRN
  Administered 2023-05-10 (×2): 5000 [IU] via INTRAVENOUS

## 2023-05-10 MED ORDER — SODIUM CHLORIDE 0.9 % IV SOLN: INTRAVENOUS | Status: AC

## 2023-05-10 MED ORDER — FENTANYL CITRATE (PF) 100 MCG/2ML IJ SOLN
INTRAMUSCULAR | Status: AC
  Filled 2023-05-10: qty 2

## 2023-05-10 MED ORDER — ASPIRIN 81 MG PO CHEW
CHEWABLE_TABLET | ORAL | Status: DC | PRN
  Administered 2023-05-10: 162 mg via ORAL

## 2023-05-10 MED ORDER — SODIUM CHLORIDE 0.9 % WEIGHT BASED INFUSION
3.0000 mL/kg/h | INTRAVENOUS | Status: DC
  Administered 2023-05-10: 3 mL/kg/h via INTRAVENOUS

## 2023-05-10 MED ORDER — ONDANSETRON HCL 4 MG/2ML IJ SOLN: 4.0000 mg | Freq: Four times a day (QID) | INTRAMUSCULAR | Status: DC | PRN

## 2023-05-10 MED ORDER — HEPARIN SODIUM (PORCINE) 1000 UNIT/ML IJ SOLN
INTRAMUSCULAR | Status: AC
  Filled 2023-05-10: qty 10

## 2023-05-10 MED ORDER — ENOXAPARIN SODIUM 40 MG/0.4ML IJ SOSY
40.0000 mg | PREFILLED_SYRINGE | INTRAMUSCULAR | Status: DC
  Administered 2023-05-11: 40 mg via SUBCUTANEOUS
  Filled 2023-05-10: qty 0.4

## 2023-05-10 MED ORDER — HEPARIN (PORCINE) IN NACL 1000-0.9 UT/500ML-% IV SOLN
INTRAVENOUS | Status: AC
  Filled 2023-05-10: qty 1000

## 2023-05-10 MED ORDER — ASPIRIN 81 MG PO CHEW
CHEWABLE_TABLET | ORAL | Status: AC
  Filled 2023-05-10: qty 2

## 2023-05-10 MED ORDER — SODIUM CHLORIDE 0.9 % IV SOLN: 250.0000 mL | INTRAVENOUS | Status: DC | PRN

## 2023-05-10 MED ORDER — ASPIRIN 81 MG PO CHEW: 81.0000 mg | CHEWABLE_TABLET | ORAL | Status: DC

## 2023-05-10 MED ORDER — LIDOCAINE HCL 1 % IJ SOLN
INTRAMUSCULAR | Status: AC
  Filled 2023-05-10: qty 20

## 2023-05-10 MED ORDER — MIDAZOLAM HCL 2 MG/2ML IJ SOLN
INTRAMUSCULAR | Status: AC
  Filled 2023-05-10: qty 2

## 2023-05-10 MED ORDER — TICAGRELOR 90 MG PO TABS
ORAL_TABLET | ORAL | Status: AC
  Filled 2023-05-10: qty 2

## 2023-05-10 MED ORDER — FENTANYL CITRATE (PF) 100 MCG/2ML IJ SOLN
INTRAMUSCULAR | Status: DC | PRN
  Administered 2023-05-10 (×2): 25 ug via INTRAVENOUS

## 2023-05-10 MED ORDER — LIDOCAINE HCL (PF) 1 % IJ SOLN
INTRAMUSCULAR | Status: DC | PRN
  Administered 2023-05-10: 2 mL

## 2023-05-10 MED ORDER — VERAPAMIL HCL 2.5 MG/ML IV SOLN
INTRAVENOUS | Status: DC | PRN
  Administered 2023-05-10: 2.5 mg via INTRA_ARTERIAL

## 2023-05-10 MED ORDER — VERAPAMIL HCL 2.5 MG/ML IV SOLN
INTRAVENOUS | Status: AC
  Filled 2023-05-10: qty 2

## 2023-05-10 MED ORDER — SODIUM CHLORIDE 0.9% FLUSH: 3.0000 mL | INTRAVENOUS | Status: DC | PRN

## 2023-05-10 MED ORDER — NITROGLYCERIN 1 MG/10 ML FOR IR/CATH LAB
INTRA_ARTERIAL | Status: AC
  Filled 2023-05-10: qty 10

## 2023-05-10 MED ORDER — TICAGRELOR 90 MG PO TABS
ORAL_TABLET | ORAL | Status: DC | PRN
  Administered 2023-05-10: 180 mg via ORAL

## 2023-05-10 MED ORDER — IOHEXOL 300 MG/ML  SOLN
INTRAMUSCULAR | Status: DC | PRN
  Administered 2023-05-10: 180 mL

## 2023-05-10 MED ORDER — HEPARIN BOLUS VIA INFUSION
1400.0000 [IU] | Freq: Once | INTRAVENOUS | Status: AC
  Administered 2023-05-10: 1400 [IU] via INTRAVENOUS
  Filled 2023-05-10: qty 1400

## 2023-05-10 MED ORDER — MIDAZOLAM HCL 2 MG/2ML IJ SOLN
INTRAMUSCULAR | Status: DC | PRN
  Administered 2023-05-10 (×2): 1 mg via INTRAVENOUS

## 2023-05-10 MED ORDER — SODIUM CHLORIDE 0.9 % WEIGHT BASED INFUSION: 1.0000 mL/kg/h | INTRAVENOUS | Status: DC

## 2023-05-10 MED ORDER — NITROGLYCERIN 1 MG/10 ML FOR IR/CATH LAB
INTRA_ARTERIAL | Status: DC | PRN
  Administered 2023-05-10: 200 ug via INTRACORONARY

## 2023-05-10 SURGICAL SUPPLY — 18 items
BALLN ~~LOC~~ TREK NEO RX 4.0X12 (BALLOONS) IMPLANT
CATH DRAGONFLY OPSTAR (CATHETERS) IMPLANT
CATH INFINITI 5 FR JL3.5 (CATHETERS) IMPLANT
CATH INFINITI 5FR JK (CATHETERS) IMPLANT
CATH LAUNCHER 6FR EBU3.5 (CATHETERS) IMPLANT
DEVICE RAD TR BAND REGULAR (VASCULAR PRODUCTS) IMPLANT
DRAPE BRACHIAL (DRAPES) IMPLANT
GLIDESHEATH SLEND SS 6F .021 (SHEATH) IMPLANT
GUIDEWIRE INQWIRE 1.5J.035X260 (WIRE) IMPLANT
INQWIRE 1.5J .035X260CM (WIRE) ×1
KIT ENCORE 26 ADVANTAGE (KITS) IMPLANT
PACK CARDIAC CATH (CUSTOM PROCEDURE TRAY) ×1 IMPLANT
PROTECTION STATION PRESSURIZED (MISCELLANEOUS) ×1
SET ATX-X65L (MISCELLANEOUS) IMPLANT
STATION PROTECTION PRESSURIZED (MISCELLANEOUS) IMPLANT
STENT ONYX FRONTIER 3.5X15 (Permanent Stent) IMPLANT
TUBING CIL FLEX 10 FLL-RA (TUBING) IMPLANT
WIRE RUNTHROUGH .014X180CM (WIRE) IMPLANT

## 2023-05-10 NOTE — TOC Benefit Eligibility Note (Signed)
Pharmacy Patient Advocate Encounter  Insurance verification completed.    The patient is insured through [employer]   Ran test claim for Brilinta 90 mg and the current 30 day co-pay is $4.00.   This test claim was processed through [facility]- copay amounts may vary at other pharmacies due to pharmacy/plan contracts, or as the patient moves through the different stages of their insurance plan.    Roland Earl, CPHT Pharmacy Patient Advocate Specialist [facility] Patient Advocate Team Direct Number: [phone]  Fax: [phone]

## 2023-05-10 NOTE — Progress Notes (Signed)
ANTICOAGULATION CONSULT NOTE  Pharmacy Consult for heparin infusion Indication: ACS/STEMI  No Known Allergies  Patient Measurements: Height: 5\' 11"  (180.3 cm) Weight: 92.9 kg (204 lb 12.9 oz) IBW/kg (Calculated) : 75.3 Heparin Dosing Weight: 95.8 kg  Vital Signs: Temp: 97.6 F (36.4 C) (07/31 0343) Temp Source: Oral (07/30 2215) BP: 108/84 (07/31 0120) Pulse Rate: 59 (07/31 0120)  Labs: Recent Labs    05/09/23 1716 05/09/23 1840 05/09/23 1923 05/10/23 0058 05/10/23 0427 05/10/23 0649  HGB 19.8*  --   --   --  17.1*  --   HCT 57.7*  --   --   --  50.9  --   PLT 233  --   --   --  168  --   APTT  --  25  --   --  46*  --   LABPROT  --  14.3  --   --  14.4  --   INR  --  1.1  --   --  1.1  --   HEPARINUNFRC  --   --   --  0.31  --  0.24*  CREATININE 1.22  --   --   --  0.72  --   TROPONINIHS 436*  --  511*  --   --   --     Estimated Creatinine Clearance: 114.3 mL/min (by C-G formula based on SCr of 0.72 mg/dL).   Medical History: Past Medical History:  Diagnosis Date   Diabetes mellitus without complication (HCC)    GERD (gastroesophageal reflux disease)    Gout    Hypertension    Migraines     Assessment:  62 yo male presenting to ED radiating chest pain, found with elevated Troponin I level. PMH includes HTN, DM, migraines, GERD,and tobacco use. Pharmacy was consulted to manage heparin infusion ACS indication.  Goal of Therapy:  Heparin level 0.3-0.7 units/ml Monitor platelets by anticoagulation protocol: Yes   Date/Time HL  Comments 7/31@0058  HL=0.31 Therapeutic x 1 @ 1250 un/hr 7/31@0746  HL=0.24 Subtherapeutic, rate 1250un/hr ->1400un/hr  Plan:  HL subtherapeutic Give Heparin 1400 units IV bolus x 1 dose, then Increase heparin infusion rate to 1400 un/hr Repeat HL 6hr after rate change and daily once stable CBC daily while on heparin  Gunner Iodice Rodriguez-Guzman PharmD, BCPS 05/10/2023 7:51 AM

## 2023-05-10 NOTE — Progress Notes (Signed)
  Chaplain On-Call responded to Spiritual Care Consult from Azell Der, RN. The request was for Advance Directives information for the patient.  Chaplain met the patient and provided the AD documents and education to him. Chaplain explained the process for completion if he wishes to do so while in the hospital.  The patient stated that he is going to have a cardiac catheterization this afternoon.  Chaplain provided spiritual and emotional support.  Chaplain Morene Crocker., [facility]

## 2023-05-10 NOTE — Plan of Care (Signed)

## 2023-05-10 NOTE — Assessment & Plan Note (Signed)
Amlodipine, lisinopril and Imdur are listed in his medications but he was not taking any medication at this time. -He was started on metoprolol by cardiology -We can add more agent as needed

## 2023-05-10 NOTE — Hospital Course (Addendum)
Taken from H&P.   Micheal Barrett is a 62 y.o. male with medical history significant of hypertension, smoking Presented with sudden onset substernal chest pain, associated with shortness of breath and diaphoresis.  Troponin elevated at 430>>511, patient was started on heparin infusion and admitted for concern of NSTEMI.  EKG with right bundle branch block which is chronic and T wave inversions in lateral leads.  7/31: Vital stable.  Lipid panel with borderline elevated triglyceride at 154, HDL 23 and LDL 91.  Repeat troponin at 118.  No chest pain today but he has quite typical symptoms and did develop some shortness of breath and dizziness with ambulation today.  Cardiology saw him and decided to take him to Cath Lab for further evaluation.  Patient was on amlodipine, lisinopril and Imdur at home but apparently was not taking any medications at this time.  8/1: Hemodynamically stable.  He was eager to go home as there was no one there to watch his 3 dogs.  S/p PCI to LAD.  He was started on aspirin and Brilinta.  Imdur was also added by cardiology.  They decreased the dose of metoprolol and stop amlodipine.  Patient was discharged by cardiology on current medications and need to have a close follow-up with them for further recommendations.

## 2023-05-10 NOTE — Progress Notes (Signed)
  Progress Note   Patient: Micheal Barrett QIO:[id number] DOB: [birth year] DOA: 05/09/2023     1 DOS: the patient was seen and examined on 05/10/2023   Brief hospital course: Taken from H&P.   Micheal Barrett is a 62 y.o. male with medical history significant of hypertension, smoking Presented with sudden onset substernal chest pain, associated with shortness of breath and diaphoresis.  Troponin elevated at 430>>511, patient was started on heparin infusion and admitted for concern of NSTEMI.  EKG with right bundle branch block which is chronic and T wave inversions in lateral leads.  7/31: Vital stable.  Lipid panel with borderline elevated triglyceride at 154, HDL 23 and LDL 91.  Repeat troponin at 118.  No chest pain today but he has quite typical symptoms and did develop some shortness of breath and dizziness with ambulation today.  Cardiology saw him and decided to take him to Cath Lab for further evaluation.  Patient was on amlodipine, lisinopril and Imdur at home but apparently was not taking any medications at this time.    Assessment and Plan: * NSTEMI (non-ST elevated myocardial infarction) (HCC) Concern of NSTEMI versus unstable angina.  Troponin peaked at 511. Patient was started on heparin infusion.  Cardiology decided to take him to Cath Lab. -Follow-up post cath recommendations -Continue with aspirin and statin  DM (diabetes mellitus) type 2, uncontrolled, with ketoacidosis (HCC) Metformin was listed in his chart but apparently patient was not taking any medications. -A1c pending -Continue with SSI  HTN (hypertension) Amlodipine, lisinopril and Imdur are listed in his medications but he was not taking any medication at this time. -He was started on metoprolol by cardiology -We can add more agent as needed  Erythrocytosis Likely some element of dehydration as hemoglobin improved to 17 with IV fluid. -Erythropoietin results pending -If hemoglobin remains elevated-he  will get benefit from outpatient hematologic evaluation  Nicotine abuse Counseling was provided -Nicotine patch as needed  Hyperlipidemia Patient was not taking his home Lipitor. -Restarting statin   Subjective: Patient was seen and examined today.  No chest pain.  He did have mild shortness of breath and dizziness with ambulation.  Physical Exam: Vitals:   05/10/23 1401 05/10/23 1406 05/10/23 1411 05/10/23 1416  BP: (!) 143/98 (!) 136/94 (!) 137/96 (!) 137/101  Pulse: (!) 54 (!) 51 (!) 56 (!) 56  Resp: 15 16 18 19   Temp:      TempSrc:      SpO2: 96% 95% 96% 97%  Weight:      Height:       General.  Well-developed gentleman, in no acute distress. Pulmonary.  Lungs clear bilaterally, normal respiratory effort. CV.  Regular rate and rhythm, no JVD, rub or murmur. Abdomen.  Soft, nontender, nondistended, BS positive. CNS.  Alert and oriented .  No focal neurologic deficit. Extremities.  No edema, no cyanosis, pulses intact and symmetrical. Psychiatry.  Judgment and insight appears normal.   Data Reviewed: Prior data reviewed  Family Communication: Discussed with patient  Disposition: Status is: Inpatient Remains inpatient appropriate because: Severity of illness  Planned Discharge Destination: Home  Time spent: 50 minutes  This record has been created using Conservation officer, historic buildings. Errors have been sought and corrected,but may not always be located. Such creation errors do not reflect on the standard of care.   Author: Arnetha Courser, MD 05/10/2023 2:35 PM  For on call review [email or web address].

## 2023-05-10 NOTE — Progress Notes (Signed)
Patient reported that his breathing is shallow. Contacted Dr. Kirke Corin. Advised to give caffienated beverage.  Encouraged caffeinated beverages.

## 2023-05-10 NOTE — Plan of Care (Signed)

## 2023-05-10 NOTE — Progress Notes (Signed)
ANTICOAGULATION CONSULT NOTE  Pharmacy Consult for heparin infusion Indication: ACS/STEMI  No Known Allergies  Patient Measurements: Height: 5\' 11"  (180.3 cm) Weight: 92.9 kg (204 lb 12.9 oz) IBW/kg (Calculated) : 75.3 Heparin Dosing Weight: 95.8 kg  Vital Signs: Temp: 97.7 F (36.5 C) (07/30 2215) Temp Source: Oral (07/30 2215) BP: 108/84 (07/31 0120) Pulse Rate: 59 (07/31 0120)  Labs: Recent Labs    05/09/23 1716 05/09/23 1840 05/09/23 1923 05/10/23 0058  HGB 19.8*  --   --   --   HCT 57.7*  --   --   --   PLT 233  --   --   --   APTT  --  25  --   --   LABPROT  --  14.3  --   --   INR  --  1.1  --   --   HEPARINUNFRC  --   --   --  0.31  CREATININE 1.22  --   --   --   TROPONINIHS 436*  --  511*  --     Estimated Creatinine Clearance: 75 mL/min (by C-G formula based on SCr of 1.22 mg/dL).   Medical History: Past Medical History:  Diagnosis Date   Diabetes mellitus without complication (HCC)    GERD (gastroesophageal reflux disease)    Gout    Hypertension    Migraines     Assessment: Pt is a 62 yo male presenting to ED radiating chest pain, found with elevated Troponin I level.  Goal of Therapy:  Heparin level 0.3-0.7 units/ml Monitor platelets by anticoagulation protocol: Yes   Plan:  7/31:  HL @ 0058 = 0.31, therapeutic X 1 - Will continue pt on current rate and recheck HL on 7/31 @ 0700 CBC daily while on heparin  Doug Bucklin D 05/10/2023 1:35 AM
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
## 2023-05-10 NOTE — Assessment & Plan Note (Signed)
Patient was not taking his home Lipitor. -Restarting statin

## 2023-05-10 NOTE — Progress Notes (Signed)
Transition of Care [facility]) - Inpatient Brief Assessment   Patient Details  Name: ELDER SZAFRAN MRN: [id number] Date of Birth: 02/11/1961  Transition of Care [facility]) CM/SW Contact:    Truddie Hidden, RN Phone Number: 05/10/2023, 12:47 PM   Clinical Narrative: TOC assessing for ongoing needs and discharge planning.   Transition of Care Asessment: Insurance and Status: Insurance coverage has been reviewed Patient has primary care physician: Yes Home environment has been reviewed: Return to previous environment Prior level of function:: Independent Prior/Current Home Services: No current home services Social Determinants of Health Reivew: SDOH reviewed no interventions necessary Readmission risk has been reviewed: Yes Transition of care needs: no transition of care needs at this time

## 2023-05-11 ENCOUNTER — Telehealth: Payer: Self-pay | Admitting: Cardiology

## 2023-05-11 ENCOUNTER — Encounter: Payer: Self-pay | Admitting: Cardiovascular Disease

## 2023-05-11 DIAGNOSIS — I214 Non-ST elevation (NSTEMI) myocardial infarction: Secondary | ICD-10-CM | POA: Diagnosis not present

## 2023-05-11 DIAGNOSIS — I10 Essential (primary) hypertension: Secondary | ICD-10-CM | POA: Diagnosis not present

## 2023-05-11 DIAGNOSIS — D751 Secondary polycythemia: Secondary | ICD-10-CM | POA: Diagnosis not present

## 2023-05-11 DIAGNOSIS — E785 Hyperlipidemia, unspecified: Secondary | ICD-10-CM | POA: Diagnosis not present

## 2023-05-11 DIAGNOSIS — Z720 Tobacco use: Secondary | ICD-10-CM | POA: Diagnosis not present

## 2023-05-11 DIAGNOSIS — I25118 Atherosclerotic heart disease of native coronary artery with other forms of angina pectoris: Secondary | ICD-10-CM

## 2023-05-11 DIAGNOSIS — E1110 Type 2 diabetes mellitus with ketoacidosis without coma: Secondary | ICD-10-CM | POA: Diagnosis not present

## 2023-05-11 DIAGNOSIS — E782 Mixed hyperlipidemia: Secondary | ICD-10-CM

## 2023-05-11 LAB — GLUCOSE, CAPILLARY: Glucose-Capillary: 166 mg/dL — ABNORMAL HIGH (ref 70–99)

## 2023-05-11 MED ORDER — LISINOPRIL 20 MG PO TABS
40.0000 mg | ORAL_TABLET | Freq: Every day | ORAL | Status: DC
  Administered 2023-05-11: 40 mg via ORAL
  Filled 2023-05-11: qty 2

## 2023-05-11 MED ORDER — ISOSORBIDE MONONITRATE ER 30 MG PO TB24
30.0000 mg | ORAL_TABLET | Freq: Every day | ORAL | Status: DC
  Administered 2023-05-11: 30 mg via ORAL
  Filled 2023-05-11: qty 1

## 2023-05-11 MED ORDER — TICAGRELOR 90 MG PO TABS: 90.0000 mg | ORAL_TABLET | Freq: Two times a day (BID) | ORAL | 1 refills | Status: DC

## 2023-05-11 MED ORDER — ISOSORBIDE MONONITRATE ER 30 MG PO TB24: 30.0000 mg | ORAL_TABLET | Freq: Every day | ORAL | 1 refills | Status: DC

## 2023-05-11 MED ORDER — METOPROLOL SUCCINATE ER 25 MG PO TB24
12.5000 mg | ORAL_TABLET | Freq: Every day | ORAL | Status: DC
  Administered 2023-05-11: 12.5 mg via ORAL

## 2023-05-11 MED ORDER — NICOTINE POLACRILEX 2 MG MT GUM: 2.0000 mg | CHEWING_GUM | OROMUCOSAL | 0 refills | Status: AC | PRN

## 2023-05-11 MED ORDER — METOPROLOL SUCCINATE ER 25 MG PO TB24: 12.5000 mg | ORAL_TABLET | Freq: Every day | ORAL | 1 refills | Status: AC

## 2023-05-11 MED ORDER — ATORVASTATIN CALCIUM 80 MG PO TABS: 80.0000 mg | ORAL_TABLET | Freq: Every day | ORAL | 1 refills | Status: DC

## 2023-05-11 MED ORDER — PANTOPRAZOLE SODIUM 40 MG PO TBEC: 40.0000 mg | DELAYED_RELEASE_TABLET | Freq: Every day | ORAL | 1 refills | Status: DC

## 2023-05-11 MED ORDER — LISINOPRIL 40 MG PO TABS: 40.0000 mg | ORAL_TABLET | Freq: Every day | ORAL | 1 refills | Status: DC

## 2023-05-11 MED ORDER — ASPIRIN 81 MG PO TBEC: 81.0000 mg | DELAYED_RELEASE_TABLET | Freq: Every day | ORAL | 12 refills | Status: AC

## 2023-05-11 MED ORDER — NICOTINE 14 MG/24HR TD PT24: 14.0000 mg | MEDICATED_PATCH | Freq: Every day | TRANSDERMAL | 0 refills | Status: AC

## 2023-05-11 MED ORDER — ISOSORBIDE MONONITRATE ER 30 MG PO TB24: 30.0000 mg | ORAL_TABLET | Freq: Two times a day (BID) | ORAL | Status: DC

## 2023-05-11 NOTE — Progress Notes (Signed)
Pt in so much hurry to d/c cards at bedside, d/c orders placed by Dr Mariah Milling, dr Nelson Chimes aware of the d/c, paperwork and Rx meds discussed with pt. PIVs d/c

## 2023-05-11 NOTE — Telephone Encounter (Signed)
   Transition of Care Follow-up Phone Call Request    Patient Name: Micheal Barrett Date of Birth: 1961/01/31 Date of Encounter: 05/11/2023  Primary Care Provider:  [facility] [place] Primary Cardiologist:  Julien Nordmann, MD  Micheal Barrett needs to be scheduled for a transition of care follow up appointment with a [facility] provider:  In 7 days after NSTEMI with stent placement to the pLAD  Please reach out to [employer] within 48 hours to confirm appointment and review transition of care protocol questionnaire.  Ndia Sampath, NP  05/11/2023, 10:28 AM

## 2023-05-11 NOTE — Assessment & Plan Note (Signed)
Concern of NSTEMI versus unstable angina.  Troponin peaked at 511. Patient was started on heparin infusion.  S/p cardiac catheterization and PCI to LAD. -Patient was started on aspirin and Brilinta -Imdur and low-dose metoprolol was also added.

## 2023-05-11 NOTE — Discharge Summary (Signed)
Physician Discharge Summary   Patient: Micheal Barrett MRN: [id number] DOB: 03-27-1961  Admit date:     05/09/2023  Discharge date: 05/11/23  Discharge Physician: Arnetha Courser   PCP: [facility]   Recommendations at discharge:  Please obtain CBC and BMP within a week Follow-up with cardiology Follow-up with cardiac rehab Follow-up with PCP  Discharge Diagnoses: Principal Problem:   NSTEMI (non-ST elevated myocardial infarction) (HCC) Active Problems:   DM (diabetes mellitus) type 2, uncontrolled, with ketoacidosis (HCC)   HTN (hypertension)   Erythrocytosis   Nicotine abuse   Hyperlipidemia   Coronary artery disease of native artery of native heart with stable angina pectoris [facility])   Hospital Course: Taken from H&P.   ELLIAN Barrett is a 62 y.o. male with medical history significant of hypertension, smoking Presented with sudden onset substernal chest pain, associated with shortness of breath and diaphoresis.  Troponin elevated at 430>>511, patient was started on heparin infusion and admitted for concern of NSTEMI.  EKG with right bundle branch block which is chronic and T wave inversions in lateral leads.  7/31: Vital stable.  Lipid panel with borderline elevated triglyceride at 154, HDL 23 and LDL 91.  Repeat troponin at 118.  No chest pain today but he has quite typical symptoms and did develop some shortness of breath and dizziness with ambulation today.  Cardiology saw him and decided to take him to Cath Lab for further evaluation.  Patient was on amlodipine, lisinopril and Imdur at home but apparently was not taking any medications at this time.  8/1: Hemodynamically stable.  He was eager to go home as there was no one there to watch his 3 dogs.  S/p PCI to LAD.  He was started on aspirin and Brilinta.  Imdur was also added by cardiology.  They decreased the dose of metoprolol and stop amlodipine.  Patient was discharged by cardiology on current medications and  need to have a close follow-up with them for further recommendations.   Assessment and Plan: * NSTEMI (non-ST elevated myocardial infarction) (HCC) Concern of NSTEMI versus unstable angina.  Troponin peaked at 511. Patient was started on heparin infusion.  S/p cardiac catheterization and PCI to LAD. -Patient was started on aspirin and Brilinta -Imdur and low-dose metoprolol was also added.  DM (diabetes mellitus) type 2, uncontrolled, with ketoacidosis (HCC) Metformin was listed in his chart but apparently patient was not taking any medications. -A1c pending -Continue with SSI  HTN (hypertension) Amlodipine, lisinopril and Imdur are listed in his medications but he was not taking any medication at this time. -He was started on metoprolol by cardiology -Per cardiology lisinopril was added and amlodipine was discontinued  Erythrocytosis Likely some element of dehydration as hemoglobin improved to 17 with IV fluid. -Erythropoietin results pending -If hemoglobin remains elevated-he will get benefit from outpatient hematologic evaluation  Nicotine abuse Counseling was provided -Nicotine patch as needed  Hyperlipidemia Patient was not taking his home Lipitor. -Restarting statin   Consultants: Cardiology Procedures performed: Cardiac catheterization and PCI Disposition: Home Diet recommendation:  Cardiac and Carb modified diet DISCHARGE MEDICATION: Allergies as of 05/11/2023   No Known Allergies      Medication List     STOP taking these medications    albuterol 108 (90 Base) MCG/ACT inhaler Commonly known as: VENTOLIN HFA   amLODipine 5 MG tablet Commonly known as: NORVASC   BC HEADACHE POWDER PO   colchicine 0.6 MG tablet   cyclobenzaprine 5 MG tablet Commonly  known as: FLEXERIL   fluticasone 50 MCG/ACT nasal spray Commonly known as: Flonase   gabapentin 300 MG capsule Commonly known as: NEURONTIN   HYDROcodone-acetaminophen 5-325 MG tablet Commonly  known as: NORCO/VICODIN   ketorolac 10 MG tablet Commonly known as: TORADOL   metFORMIN 500 MG tablet Commonly known as: GLUCOPHAGE   omeprazole 40 MG capsule Commonly known as: PRILOSEC Replaced by: pantoprazole 40 MG tablet       TAKE these medications    aspirin EC 81 MG tablet Take 1 tablet (81 mg total) by mouth daily. Swallow whole. Start taking on: May 12, 2023   atorvastatin 80 MG tablet Commonly known as: LIPITOR Take 1 tablet (80 mg total) by mouth at bedtime. What changed:  medication strength how much to take when to take this   isosorbide mononitrate 30 MG 24 hr tablet Commonly known as: IMDUR Take 1 tablet (30 mg total) by mouth daily.   lisinopril 40 MG tablet Commonly known as: ZESTRIL Take 1 tablet (40 mg total) by mouth daily. Start taking on: May 12, 2023 What changed:  medication strength how much to take   metoprolol succinate 25 MG 24 hr tablet Commonly known as: TOPROL-XL Take 0.5 tablets (12.5 mg total) by mouth daily. Start taking on: May 12, 2023   nicotine 14 mg/24hr patch Commonly known as: NICODERM CQ - dosed in mg/24 hours Place 1 patch (14 mg total) onto the skin daily. Start taking on: May 12, 2023   nicotine polacrilex 2 MG gum Commonly known as: NICORETTE Take 1 each (2 mg total) by mouth as needed for smoking cessation.   pantoprazole 40 MG tablet Commonly known as: PROTONIX Take 1 tablet (40 mg total) by mouth daily. Start taking on: May 12, 2023 Replaces: omeprazole 40 MG capsule   sertraline 50 MG tablet Commonly known as: ZOLOFT Take 50 mg by mouth daily.   ticagrelor 90 MG Tabs tablet Commonly known as: BRILINTA Take 1 tablet (90 mg total) by mouth 2 (two) times daily.        Follow-up Information     Gollan, Tollie Pizza, MD. Call in 2 week(s).   Specialty: Cardiology Contact information: [address] [place] [address] [phone]                Discharge  Exam: Ceasar Mons Weights   05/09/23 1714 05/09/23 2215 05/10/23 1219  Weight: 99.8 kg 92.9 kg 92.9 kg   General.  Well-developed gentleman, in no acute distress. Pulmonary.  Lungs clear bilaterally, normal respiratory effort. CV.  Regular rate and rhythm, no JVD, rub or murmur. Abdomen.  Soft, nontender, nondistended, BS positive. CNS.  Alert and oriented .  No focal neurologic deficit. Extremities.  No edema, no cyanosis, pulses intact and symmetrical. Psychiatry.  Judgment and insight appears normal.   Condition at discharge: stable  The results of significant diagnostics from this hospitalization (including imaging, microbiology, ancillary and laboratory) are listed below for reference.   Imaging Studies: ECHOCARDIOGRAM COMPLETE  Result Date: 05/11/2023    ECHOCARDIOGRAM REPORT   Patient Name:   ELVYN ALBACH Date of Exam: 05/10/2023 Medical Rec #:  [id number]        Height:       71.0 in Accession #:    [id number]       Weight:       204.8 lb Date of Birth:  23-Sep-1961       BSA:  2.130 m Patient Age:    61 years         BP:           136/85 mmHg Patient Gender: M                HR:           55 bpm. Exam Location:  [facility] Procedure: 2D Echo, Cardiac Doppler and Color Doppler Indications:     R94.31 Abnormal EKG  History:         Patient has prior history of Echocardiogram examinations, most                  recent 08/25/2016. Arrythmias:RBBB, Signs/Symptoms:Chest Pain;                  Risk Factors:Hypertension and Current Smoker.  Sonographer:     Sedonia Small Rodgers-Jones RDCS Referring Phys:  [id number] VFIEPPIR A ARIDA Diagnosing Phys: Julien Nordmann MD IMPRESSIONS  1. Left ventricular ejection fraction, by estimation, is 40 to 45%. The left ventricle has mildly decreased function. The left ventricle demonstrates regional wall motion abnormalities (hypokinesis of the inferior wall). There is moderate concerntric left ventricular hypertrophy including the apical region. Left ventricular  diastolic parameters are consistent with Grade II diastolic dysfunction (pseudonormalization).  2. Right ventricular systolic function is normal. The right ventricular size is normal. Tricuspid regurgitation signal is inadequate for assessing PA pressure.  3. Left atrial size was moderately dilated.  4. The mitral valve is normal in structure. Mild mitral valve regurgitation. No evidence of mitral stenosis.  5. The aortic valve is normal in structure. There is mild calcification of the aortic valve. Aortic valve regurgitation is not visualized. Aortic valve sclerosis/calcification is present, without any evidence of aortic stenosis.  6. The inferior vena cava is normal in size with greater than 50% respiratory variability, suggesting right atrial pressure of 3 mmHg. FINDINGS  Left Ventricle: Left ventricular ejection fraction, by estimation, is 40 to 45%. The left ventricle has mildly decreased function. The left ventricle demonstrates regional wall motion abnormalities. The left ventricular internal cavity size was normal in size. There is moderate left ventricular hypertrophy. Left ventricular diastolic parameters are consistent with Grade II diastolic dysfunction (pseudonormalization). Right Ventricle: The right ventricular size is normal. No increase in right ventricular wall thickness. Right ventricular systolic function is normal. Tricuspid regurgitation signal is inadequate for assessing PA pressure. Left Atrium: Left atrial size was moderately dilated. Right Atrium: Right atrial size was normal in size. Pericardium: There is no evidence of pericardial effusion. Mitral Valve: The mitral valve is normal in structure. Mild mitral valve regurgitation. No evidence of mitral valve stenosis. Tricuspid Valve: The tricuspid valve is normal in structure. Tricuspid valve regurgitation is not demonstrated. No evidence of tricuspid stenosis. Aortic Valve: The aortic valve is normal in structure. There is mild  calcification of the aortic valve. Aortic valve regurgitation is not visualized. Aortic valve sclerosis/calcification is present, without any evidence of aortic stenosis. Pulmonic Valve: The pulmonic valve was normal in structure. Pulmonic valve regurgitation is not visualized. No evidence of pulmonic stenosis. Aorta: The aortic root is normal in size and structure. Venous: The inferior vena cava is normal in size with greater than 50% respiratory variability, suggesting right atrial pressure of 3 mmHg. IAS/Shunts: No atrial level shunt detected by color flow Doppler.  LEFT VENTRICLE PLAX 2D LVIDd:         4.40 cm   Diastology LVIDs:  2.90 cm   LV e' medial:    5.66 cm/s LV PW:         1.40 cm   LV E/e' medial:  8.3 LV IVS:        1.40 cm   LV e' lateral:   7.40 cm/s LVOT diam:     2.30 cm   LV E/e' lateral: 6.4 LV SV:         77 LV SV Index:   36 LVOT Area:     4.15 cm  RIGHT VENTRICLE             IVC RV Basal diam:  4.20 cm     IVC diam: 2.40 cm RV S prime:     11.30 cm/s TAPSE (M-mode): 2.3 cm LEFT ATRIUM              Index        RIGHT ATRIUM           Index LA diam:        5.00 cm  2.35 cm/m   RA Area:     17.60 cm LA Vol (A2C):   89.7 ml  42.11 ml/m  RA Volume:   50.80 ml  23.85 ml/m LA Vol (A4C):   116.0 ml 54.46 ml/m LA Biplane Vol: 107.0 ml 50.24 ml/m  AORTIC VALVE LVOT Vmax:   86.45 cm/s LVOT Vmean:  53.700 cm/s LVOT VTI:    0.184 m  AORTA Ao Root diam: 3.80 cm Ao Asc diam:  3.70 cm MITRAL VALVE MV Area (PHT): 3.06 cm    SHUNTS MV Decel Time: 248 msec    Systemic VTI:  0.18 m MV E velocity: 47.15 cm/s  Systemic Diam: 2.30 cm MV A velocity: 41.05 cm/s MV E/A ratio:  1.15 Julien Nordmann MD Electronically signed by Julien Nordmann MD Signature Date/Time: 05/11/2023/12:06:32 PM    Final    CARDIAC CATHETERIZATION  Result Date: 05/10/2023   Mid RCA to Dist RCA lesion is 90% stenosed.   1st Diag lesion is 50% stenosed.   Prox LAD lesion is 90% stenosed.   Prox LAD to Mid LAD lesion is 30%  stenosed.   Dist LAD lesion is 30% stenosed.   2nd Mrg lesion is 100% stenosed.   A drug-eluting stent was successfully placed using a STENT ONYX FRONTIER [id number].   Post intervention, there is a 0% residual stenosis.   There is moderate left ventricular systolic dysfunction.   LV end diastolic pressure is moderately elevated.   The left ventricular ejection fraction is 35-45% by visual estimate. 1.  Severe underlying three-vessel coronary artery disease.  OM 2 appears to be chronically occluded with faint collaterals.  There is high-grade proximal LAD stenosis and severe stenosis in mid to distal right coronary artery with large organized thrombus and left to right collaterals. 2.  Moderately reduced LV systolic function with severe inferior wall hypokinesis.  Moderately elevated left ventricular end-diastolic pressure at 28 mmHg. 3.  Successful OCT guided PCI and drug-eluting stent placement to the proximal LAD.  A small distal edge dissection was noted by OCT but it was confined to 1 quadrant and nonflow limiting.  Thus, it was not treated. Recommendations: Suspect that the RCA disease is subacute and given the presence of large organized thrombus, I felt that PCI would be associated with significant risk of distal embolization.  The vessel already gets collaterals from the LAD and the area appears to be infarcted based on EKG and wall motion.  Recommend dual antiplatelet therapy for at least 12 months. Aggressive treatment of risk factors. Optimize heart failure treatment.   DG Chest Port 1 View  Result Date: 05/09/2023 CLINICAL DATA:  [id number] Chest pain [id number] EXAM: PORTABLE CHEST 1 VIEW COMPARISON:  CT angio chest 05/09/2023, chest x-ray 05/09/2023 5:55 p.m. FINDINGS: The heart and mediastinal contours are within normal limits. No focal consolidation. Chronic coarsened interstitial markings with no overt pulmonary edema. No pleural effusion. No pneumothorax. No acute osseous abnormality. Possible calcific  tendinosis of the right shoulder. IMPRESSION: 1. No active disease. 2.  Emphysema (ICD10-J43.9). Electronically Signed   By: Tish Frederickson M.D.   On: 05/09/2023 23:53   CT Angio Chest PE W and/or Wo Contrast  Result Date: 05/09/2023 CLINICAL DATA:  Pulmonary embolism (PE) suspected, high prob EXAM: CT ANGIOGRAPHY CHEST WITH CONTRAST TECHNIQUE: Multidetector CT imaging of the chest was performed using the standard protocol during bolus administration of intravenous contrast. Multiplanar CT image reconstructions and MIPs were obtained to evaluate the vascular anatomy. RADIATION DOSE REDUCTION: This exam was performed according to the departmental dose-optimization program which includes automated exposure control, adjustment of the mA and/or kV according to patient size and/or use of iterative reconstruction technique. CONTRAST:  75mL OMNIPAQUE IOHEXOL 350 MG/ML SOLN COMPARISON:  None Available. FINDINGS: Cardiovascular: Satisfactory opacification of the pulmonary arteries to the segmental level. No evidence of pulmonary embolism. Enlarged heart size. No significant pericardial effusion. The thoracic aorta is normal in caliber. Mild atherosclerotic plaque of the thoracic aorta. At least three-vessel coronary artery calcifications. Mediastinum/Nodes: No enlarged mediastinal, hilar, or axillary lymph nodes. Thyroid gland, trachea, and esophagus demonstrate no significant findings. Lungs/Pleura: Mild centrilobular and paraseptal emphysematous changes. Bilateral lower lobe subsegmental atelectasis. No focal consolidation. No pulmonary nodule. No pulmonary mass. No pleural effusion. No pneumothorax. Upper Abdomen: Splenule noted. Musculoskeletal: No chest wall abnormality. No suspicious lytic or blastic osseous lesions. No acute displaced fracture. Old healed right rib fractures. Old healed right distal clavicular fracture partially visualized. Degenerative changes of the right shoulder. Review of the MIP images  confirms the above findings. IMPRESSION: 1. No pulmonary embolus. 2. Aortic Atherosclerosis (ICD10-I70.0) including coronary calcification. 3. Emphysema (ICD10-J43.9). Electronically Signed   By: Tish Frederickson M.D.   On: 05/09/2023 19:10   DG Chest 2 View  Result Date: 05/09/2023 CLINICAL DATA:  Chest pain EXAM: CHEST - 2 VIEW COMPARISON:  10/07/2018 FINDINGS: The heart size and mediastinal contours are within normal limits. Both lungs are clear. Degenerative changes of the spine. IMPRESSION: No active cardiopulmonary disease. Electronically Signed   By: Jasmine Pang M.D.   On: 05/09/2023 18:16   DG Hip Unilat W or Wo Pelvis 2-3 Views Left  Result Date: 04/12/2023 CLINICAL DATA:  Left hip pain EXAM: DG HIP (WITH OR WITHOUT PELVIS) 2-3V LEFT COMPARISON:  None Available. FINDINGS: Bones of the pelvis are normal. Mild osteoarthritic change of both hips with small marginal osteophytes, right more notable than left. Sacroiliac joints and symphysis pubis are normal. No evidence of regional fracture. IMPRESSION: Mild osteoarthritic change of both hips, right more than left. Electronically Signed   By: Paulina Fusi M.D.   On: 04/12/2023 15:41   DG Lumbar Spine 2-3 Views  Result Date: 04/12/2023 CLINICAL DATA:  left hip pain (chronically) with L lower back pain today EXAM: LUMBAR SPINE - 2 VIEW COMPARISON:  None Available. FINDINGS: There are 4 lumbar type vertebral bodies with likely sacralization of L5. The last well-formed disc space is labeled L4-L5. Vertebral body  heights are maintained. Mild multilevel degenerative changes throughout the lumbar spine most notably at L3-L4 and L4-L5. Lower lumbar spine predominant facet degenerative change, greatest at L4-L5. Evaluation of the sacrum is limited due to overlying bowel gas. IMPRESSION: Mild multilevel degenerative changes of the lumbar spine, most notably at L3-L4 and L4-L5. Electronically Signed   By: Lorenza Cambridge M.D.   On: 04/12/2023 15:20     Microbiology: Results for orders placed or performed during the hospital encounter of 11/10/20  SARS CORONAVIRUS 2 (TAT 6-24 HRS) Nasopharyngeal Nasopharyngeal Swab     Status: None   Collection Time: 11/10/20  1:00 PM   Specimen: Nasopharyngeal Swab  Result Value Ref Range Status   SARS Coronavirus 2 NEGATIVE NEGATIVE Final    Comment: (NOTE) SARS-CoV-2 target nucleic acids are NOT DETECTED.  The SARS-CoV-2 RNA is generally detectable in upper and lower respiratory specimens during the acute phase of infection. Negative results do not preclude SARS-CoV-2 infection, do not rule out co-infections with other pathogens, and should not be used as the sole basis for treatment or other patient management decisions. Negative results must be combined with clinical observations, patient history, and epidemiological information. The expected result is Negative.  Fact Sheet for Patients: HairSlick.no  Fact Sheet for Healthcare Providers: quierodirigir.com  This test is not yet approved or cleared by the [place] FDA and  has been authorized for detection and/or diagnosis of SARS-CoV-2 by FDA under an Emergency Use Authorization (EUA). This EUA will remain  in effect (meaning this test can be used) for the duration of the COVID-19 declaration under Se ction 564(b)(1) of the Act, 21 U.S.C. section 360bbb-3(b)(1), unless the authorization is terminated or revoked sooner.  Performed at [facility], [address] [place], [place] [address]     Labs: CBC: Recent Labs  Lab 05/09/23 1716 05/10/23 0427 05/10/23 1748 05/11/23 0557  WBC 9.2 7.5 7.5 8.2  HGB 19.8* 17.1* 17.4* 18.0*  HCT 57.7* 50.9 51.3 51.5  MCV 95.7 96.0 95.4 92.3  PLT 233 168 161 164   Basic Metabolic Panel: Recent Labs  Lab 05/09/23 1716 05/10/23 0427 05/10/23 1748 05/11/23 0557  NA 138 138  --  137  K 4.2 3.6  --  4.0  CL 106 110  --  106   CO2 24 22  --  24  GLUCOSE 214* 136*  --  132*  BUN 18 15  --  11  CREATININE 1.22 0.72 0.84 0.83  CALCIUM 9.2 8.7*  --  9.0   Liver Function Tests: Recent Labs  Lab 05/10/23 0427  AST 21  ALT 17  ALKPHOS 55  BILITOT 0.8  PROT 6.2*  ALBUMIN 3.2*   CBG: Recent Labs  Lab 05/10/23 0746 05/10/23 1236 05/10/23 1814 05/10/23 2109 05/11/23 0812  GLUCAP 196* 109* 200* 120* 166*    Discharge time spent: greater than 30 minutes.  This record has been created using Conservation officer, historic buildings. Errors have been sought and corrected,but may not always be located. Such creation errors do not reflect on the standard of care.   Signed: Arnetha Courser, MD [facility] 05/11/2023

## 2023-05-11 NOTE — Progress Notes (Signed)
Rounding Note    Patient Name: Micheal Barrett Date of Encounter: 05/11/2023  [facility] Cardiologist: Julien Nordmann, MD   Subjective   Patient seen on AM rounds. Denies any chest pain but has complaints of exertional dyspnea.   Inpatient Medications    Scheduled Meds:  aspirin EC  81 mg Oral Daily   atorvastatin  80 mg Oral QHS   enoxaparin (LOVENOX) injection  40 mg Subcutaneous Q24H   insulin aspart  0-15 Units Subcutaneous TID WC   insulin aspart  0-5 Units Subcutaneous QHS   metoprolol succinate  25 mg Oral Daily   nicotine  14 mg Transdermal Daily   pantoprazole  40 mg Oral Daily   sodium chloride flush  3 mL Intravenous Q12H   sodium chloride flush  3 mL Intravenous Q12H   ticagrelor  90 mg Oral BID   Continuous Infusions:  sodium chloride     nitroGLYCERIN Stopped (05/10/23 0258)   PRN Meds: sodium chloride, acetaminophen **OR** acetaminophen, albuterol, labetalol, morphine injection, nicotine polacrilex, ondansetron (ZOFRAN) IV, polyethylene glycol, sodium chloride flush   Vital Signs    Vitals:   05/10/23 1645 05/10/23 1951 05/10/23 2357 05/11/23 0452  BP: (!) 142/101 (!) 149/91 130/86 (!) 147/98  Pulse: (!) 56 (!) 56 (!) 54 60  Resp: 15 18 18 18   Temp:  97.9 F (36.6 C) 97.9 F (36.6 C) 97.7 F (36.5 C)  TempSrc:      SpO2: 98% 97% 95% 96%  Weight:      Height:        Intake/Output Summary (Last 24 hours) at 05/11/2023 0826 Last data filed at 05/11/2023 0600 Gross per 24 hour  Intake 920 ml  Output --  Net 920 ml      05/10/2023   12:19 PM 05/09/2023   10:15 PM 05/09/2023    5:14 PM  Last 3 Weights  Weight (lbs) 204 lb 12.9 oz 204 lb 12.9 oz 220 lb  Weight (kg) 92.9 kg 92.9 kg 99.791 kg      Telemetry    Sinus brady with rates of 40-50 - Personally Reviewed  ECG    No new tracings- Personally Reviewed  Physical Exam   GEN: No acute distress.   Neck: No JVD Cardiac: RRR, no murmurs, rubs, or gallops. Right radial cath  with with gauze and opsite dressing that is clean, dry , and intact without bleeding or hematoma. 2+ radial pulse Respiratory: Clear to auscultation bilaterally.Respirations are unlabored at rest on room air GI: Soft, nontender, non-distended  MS: No edema; No deformity. Neuro:  Nonfocal  Psych: Normal affect   Labs    High Sensitivity Troponin:   Recent Labs  Lab 05/09/23 1716 05/09/23 1923 05/10/23 1004 05/10/23 1748  TROPONINIHS 436* 511* 118* 90*     Chemistry Recent Labs  Lab 05/09/23 1716 05/10/23 0427 05/10/23 1748 05/11/23 0557  NA 138 138  --  137  K 4.2 3.6  --  4.0  CL 106 110  --  106  CO2 24 22  --  24  GLUCOSE 214* 136*  --  132*  BUN 18 15  --  11  CREATININE 1.22 0.72 0.84 0.83  CALCIUM 9.2 8.7*  --  9.0  PROT  --  6.2*  --   --   ALBUMIN  --  3.2*  --   --   AST  --  21  --   --   ALT  --  17  --   --  ALKPHOS  --  55  --   --   BILITOT  --  0.8  --   --   GFRNONAA >60 >60 >60 >60  ANIONGAP 8 6  --  7    Lipids  Recent Labs  Lab 05/10/23 0427  CHOL 145  TRIG 154*  HDL 23*  LDLCALC 91  CHOLHDL 6.3    Hematology Recent Labs  Lab 05/10/23 0427 05/10/23 1748 05/11/23 0557  WBC 7.5 7.5 8.2  RBC 5.30 5.38 5.58  HGB 17.1* 17.4* 18.0*  HCT 50.9 51.3 51.5  MCV 96.0 95.4 92.3  MCH 32.3 32.3 32.3  MCHC 33.6 33.9 35.0  RDW 15.3 15.4 15.0  PLT 168 161 164   Thyroid No results for input(s): "TSH", "FREET4" in the last 168 hours.  BNPNo results for input(s): "BNP", "PROBNP" in the last 168 hours.  DDimer No results for input(s): "DDIMER" in the last 168 hours.   Radiology    CARDIAC CATHETERIZATION  Result Date: 05/10/2023   Mid RCA to Dist RCA lesion is 90% stenosed.   1st Diag lesion is 50% stenosed.   Prox LAD lesion is 90% stenosed.   Prox LAD to Mid LAD lesion is 30% stenosed.   Dist LAD lesion is 30% stenosed.   2nd Mrg lesion is 100% stenosed.   A drug-eluting stent was successfully placed using a STENT ONYX FRONTIER 3.5X15.   Post  intervention, there is a 0% residual stenosis.   There is moderate left ventricular systolic dysfunction.   LV end diastolic pressure is moderately elevated.   The left ventricular ejection fraction is 35-45% by visual estimate. 1.  Severe underlying three-vessel coronary artery disease.  OM 2 appears to be chronically occluded with faint collaterals.  There is high-grade proximal LAD stenosis and severe stenosis in mid to distal right coronary artery with large organized thrombus and left to right collaterals. 2.  Moderately reduced LV systolic function with severe inferior wall hypokinesis.  Moderately elevated left ventricular end-diastolic pressure at 28 mmHg. 3.  Successful OCT guided PCI and drug-eluting stent placement to the proximal LAD.  A small distal edge dissection was noted by OCT but it was confined to 1 quadrant and nonflow limiting.  Thus, it was not treated. Recommendations: Suspect that the RCA disease is subacute and given the presence of large organized thrombus, I felt that PCI would be associated with significant risk of distal embolization.  The vessel already gets collaterals from the LAD and the area appears to be infarcted based on EKG and wall motion. Recommend dual antiplatelet therapy for at least 12 months. Aggressive treatment of risk factors. Optimize heart failure treatment.   DG Chest Port 1 View  Result Date: 05/09/2023 CLINICAL DATA:  [id number] Chest pain [id number] EXAM: PORTABLE CHEST 1 VIEW COMPARISON:  CT angio chest 05/09/2023, chest x-ray 05/09/2023 5:55 p.m. FINDINGS: The heart and mediastinal contours are within normal limits. No focal consolidation. Chronic coarsened interstitial markings with no overt pulmonary edema. No pleural effusion. No pneumothorax. No acute osseous abnormality. Possible calcific tendinosis of the right shoulder. IMPRESSION: 1. No active disease. 2.  Emphysema (ICD10-J43.9). Electronically Signed   By: Tish Frederickson M.D.   On: 05/09/2023 23:53    CT Angio Chest PE W and/or Wo Contrast  Result Date: 05/09/2023 CLINICAL DATA:  Pulmonary embolism (PE) suspected, high prob EXAM: CT ANGIOGRAPHY CHEST WITH CONTRAST TECHNIQUE: Multidetector CT imaging of the chest was performed using the standard protocol during bolus administration of intravenous  contrast. Multiplanar CT image reconstructions and MIPs were obtained to evaluate the vascular anatomy. RADIATION DOSE REDUCTION: This exam was performed according to the departmental dose-optimization program which includes automated exposure control, adjustment of the mA and/or kV according to patient size and/or use of iterative reconstruction technique. CONTRAST:  75mL OMNIPAQUE IOHEXOL 350 MG/ML SOLN COMPARISON:  None Available. FINDINGS: Cardiovascular: Satisfactory opacification of the pulmonary arteries to the segmental level. No evidence of pulmonary embolism. Enlarged heart size. No significant pericardial effusion. The thoracic aorta is normal in caliber. Mild atherosclerotic plaque of the thoracic aorta. At least three-vessel coronary artery calcifications. Mediastinum/Nodes: No enlarged mediastinal, hilar, or axillary lymph nodes. Thyroid gland, trachea, and esophagus demonstrate no significant findings. Lungs/Pleura: Mild centrilobular and paraseptal emphysematous changes. Bilateral lower lobe subsegmental atelectasis. No focal consolidation. No pulmonary nodule. No pulmonary mass. No pleural effusion. No pneumothorax. Upper Abdomen: Splenule noted. Musculoskeletal: No chest wall abnormality. No suspicious lytic or blastic osseous lesions. No acute displaced fracture. Old healed right rib fractures. Old healed right distal clavicular fracture partially visualized. Degenerative changes of the right shoulder. Review of the MIP images confirms the above findings. IMPRESSION: 1. No pulmonary embolus. 2. Aortic Atherosclerosis (ICD10-I70.0) including coronary calcification. 3. Emphysema (ICD10-J43.9).  Electronically Signed   By: Tish Frederickson M.D.   On: 05/09/2023 19:10   DG Chest 2 View  Result Date: 05/09/2023 CLINICAL DATA:  Chest pain EXAM: CHEST - 2 VIEW COMPARISON:  10/07/2018 FINDINGS: The heart size and mediastinal contours are within normal limits. Both lungs are clear. Degenerative changes of the spine. IMPRESSION: No active cardiopulmonary disease. Electronically Signed   By: Jasmine Pang M.D.   On: 05/09/2023 18:16    Cardiac Studies  Echocardiogram ordered and pending  LHC 05/10/23   Mid RCA to Dist RCA lesion is 90% stenosed.   1st Diag lesion is 50% stenosed.   Prox LAD lesion is 90% stenosed.   Prox LAD to Mid LAD lesion is 30% stenosed.   Dist LAD lesion is 30% stenosed.   2nd Mrg lesion is 100% stenosed.   A drug-eluting stent was successfully placed using a STENT ONYX FRONTIER 3.5X15.   Post intervention, there is a 0% residual stenosis.   There is moderate left ventricular systolic dysfunction.   LV end diastolic pressure is moderately elevated.   The left ventricular ejection fraction is 35-45% by visual estimate.   1.  Severe underlying three-vessel coronary artery disease.  OM 2 appears to be chronically occluded with faint collaterals.  There is high-grade proximal LAD stenosis and severe stenosis in mid to distal right coronary artery with large organized thrombus and left to right collaterals. 2.  Moderately reduced LV systolic function with severe inferior wall hypokinesis.  Moderately elevated left ventricular end-diastolic pressure at 28 mmHg. 3.  Successful OCT guided PCI and drug-eluting stent placement to the proximal LAD.  A small distal edge dissection was noted by OCT but it was confined to 1 quadrant and nonflow limiting.  Thus, it was not treated.   Recommendations: Suspect that the RCA disease is subacute and given the presence of large organized thrombus, I felt that PCI would be associated with significant risk of distal embolization.  The  vessel already gets collaterals from the LAD and the area appears to be infarcted based on EKG and wall motion. Recommend dual antiplatelet therapy for at least 12 months. Aggressive treatment of risk factors. Optimize heart failure treatment.  Patient Profile     62 y.o. male  with a history of chest pain, hypertension, diabetes, diastolic dysfunction, PSVT, chronic RBBB, tobacco abuse, gout, and migraines who is being seen and followed for the evaluation of an NSTEMI that underwent left heart catheterization on 05/10/2023 with PCI/DES to the proximal LAD.  Assessment & Plan    NSTEMI/ischemic cardiomyopathy -Presented to the emergency department on 7/20 following 2 episodes of severe exertional chest fullness/heaviness associate with dyspnea and diaphoresis -High-sensitivity troponin elevated 436, 511,118, and 90 -Status post left heart catheterization which showed severe underlying three-vessel coronary artery disease, OM 2 with chronically occluded with faint collaterals, high-grade proximal LAD stenosis with severe stenosis mid to distal right coronary artery with large organized thrombus and left-to-right collaterals -Treated with PCI/DES stent to the proximal LAD -Be continued on DAPT with aspirin and Brilinta for minimum of 12 months, patient also encouraged to decrease Goody powder intake with being on DAPT to reduce risk of bleeding -Continue with aggressive treatment of risk factors -Atorvastatin increased to 80 mg daily -Echocardiogram read pending but LVEF by visual assessment was 35-40% during procedure -Continued on Toprol XL 12.5 mg daily, restart lisinopril 40 mg daily, continue to escalate GDMT as tolerated by blood pressure and kidney function -Will need for repeat echocardiogram in 8 to 10 weeks to reevaluate heart function -Referred to cardiac rehab  Primary hypertension -Blood pressure 127/98 -Continued on Toprol-XL with reduced dose to 12.5, Imdur 30 mg daily, and  restarted PTA lisinopril at 40 mg daily (of note Home amlodipine discontinued) -Vital signs per unit protocol  Hyperlipidemia -LDL 91 -Continued on atorvastatin 80 mg daily -Repeat lipid and LFT in 10 to 12 weeks  Sinus bradycardia with chronic right bundle branch block -Telemetry monitor reveals sinus bradycardia with his 40-50 with ST depression and T wave inversions -Toprol-XL dose decreased from 25 mg daily to 12.5 mg daily  Tobacco abuse -Continues to smoke a half a pack per day -Continued on nicotine patches as well as nicotine gum -Total cessation is recommended     For questions or updates, please contact [facility] Please consult [email or web address] for contact info under        Signed, Tasnim Balentine, NP  05/11/2023, 8:26 AM

## 2023-05-11 NOTE — Assessment & Plan Note (Signed)
Amlodipine, lisinopril and Imdur are listed in his medications but he was not taking any medication at this time. -He was started on metoprolol by cardiology -Per cardiology lisinopril was added and amlodipine was discontinued

## 2023-05-12 NOTE — Telephone Encounter (Signed)
Awaiting follow up appointment.  Thanks!

## 2023-05-12 NOTE — Telephone Encounter (Signed)
Left a message for the patient to call back.  

## 2023-05-17 ENCOUNTER — Ambulatory Visit: Payer: Medicaid Other | Admitting: Cardiology

## 2023-06-15 NOTE — Progress Notes (Signed)
Cardiology Office Note:  .   Date:  06/16/2023  ID:  Micheal Barrett, DOB 1961-07-31, MRN [id number] PCP: [facility] Cardiologist:  Julien Nordmann, MD    History of Present Illness: .   Micheal Barrett is a 62 y.o. male with past medical history of hypertension, diabetes, diastolic dysfunction, PSVT, chronic RBBB, tobacco abuse, gout, migraines, and recent NSTEMI, who is here today for hospital follow-up.  Previous Myoview completed in 07/2016 which showed a small region of ischemia of mild severity in the basal lateral wall, EF 51% without regional wall motion abnormality was low to moderate risk.  Echocardiogram in 2017 showed an LVEF of 55 to 60%, no RWMA, G1 DD, mild BAE, no significant valvular disease.  ZIO monitor in 2017 showed 4 episodes of SVT likely atrial tach with the fastest 152 x 13 beats, longest 16 beats at 143 bpm triggered events for sinus rhythm.  He presented to the [facility] emergency department 05/09/2023 stating approximate 4 weeks ago he had a prednisone injection for hip pain and shortly thereafter he had an episode of chest fullness that occurred while watching TV.  He thought it was just an isolated episode and subsequently did well.  He was at work doing some inventory on 7/30 he developed recurrent severe chest fullness associated with dyspnea and profound diaphoresis.  He went outside took some deep breaths and symptoms resolved in about 15 minutes.  Went back into work and his symptoms immediately reoccurred.  At this point he went to his car and drove self to the emergency department.  Symptoms resolved prior to arrival to the ED.  EKG revealed chronic right bundle branch block with a new inferior Q waves.  High-sensitivity troponin of 436 and 511.  Labs were otherwise notable for polycythemia due to being a heavy smoker and hypoglycemia.  Chest x-ray without acute findings.  CT of the chest negative for PE but did show aortic  atherosclerosis and emphysema.  He was admitted and placed on IV heparin and nitro.  He had brief episodes of mild chest discomfort overnight but the next morning remained symptom free.  He was scheduled for left heart catheterization later that afternoon holding lunch.  She had severe underlying three-vessel coronary artery disease in the OM 2 appears to be chronically occluded with faint collaterals.  There was high-grade proximal LAD stenosis and severe stenosis in the mid to distal right coronary artery with large organized thrombus and left-to-right collaterals.  Moderately reduced LV systolic function with severe inferior wall hypokinesis.  Moderately elevated left ventricular end-diastolic pressure 28 mmHg.  He underwent successful OCT guided PCI with DES placement to the proximal LAD.  A small distal edge dissection was noted by OTC but it was confined to 1 quadrant and nonflow limiting and thus was not treated.  He was started on dual antiplatelet therapy with recommendations for minimum of 12 months of aspirin and Brilinta, aggressive treatment of risk factors, and optimization of heart failure medications.  Echocardiogram revealed an LVEF 40-45%, with moderate concentric left ventricular hypertrophy, G2 DD, mild mitral regurgitation, mild calcification of the aortic valve.  Patient was continued on aspirin and Brilinta as well as Imdur and metoprolol with a decreased use and amlodipine was discontinued.  He was considered stable and was subsequently discharged from the hospital on 05/11/2023.  He returns to clinic today stating that overall he has been doing well from a cardiac perspective.  He states that  he has noted that his blood pressure little lower and he has had some fatigue.  Denies any chest pain, shortness of breath, peripheral edema.  States that he has been compliant with his medications.  He also stated that he did miss the telephone call from cardiac rehab.  Denies any hospitalizations or  visits to the emergency department.  ROS: 10 point review of systems has been reviewed and considered negative with exception what is been listed in the HPI  Studies Reviewed: Marland Kitchen   EKG Interpretation Date/Time:  Friday June 16 2023 10:12:32 EDT Ventricular Rate:  87 PR Interval:  130 QRS Duration:  138 QT Interval:  390 QTC Calculation: 469 R Axis:   60  Text Interpretation: Normal sinus rhythm Right bundle branch block When compared with ECG of 10-May-2023 14:35, PREVIOUS ECG IS PRESENT Confirmed by Charlsie Quest ([id number]) on 06/16/2023 10:22:16 AM   TTE 05/10/23 1. Left ventricular ejection fraction, by estimation, is 40 to 45%. The  left ventricle has mildly decreased function. The left ventricle  demonstrates regional wall motion abnormalities (hypokinesis of the  inferior wall). There is moderate concerntric  left ventricular hypertrophy including the apical region. Left ventricular  diastolic parameters are consistent with Grade II diastolic dysfunction  (pseudonormalization).   2. Right ventricular systolic function is normal. The right ventricular  size is normal. Tricuspid regurgitation signal is inadequate for assessing  PA pressure.   3. Left atrial size was moderately dilated.   4. The mitral valve is normal in structure. Mild mitral valve  regurgitation. No evidence of mitral stenosis.   5. The aortic valve is normal in structure. There is mild calcification  of the aortic valve. Aortic valve regurgitation is not visualized. Aortic  valve sclerosis/calcification is present, without any evidence of aortic  stenosis.   6. The inferior vena cava is normal in size with greater than 50%  respiratory variability, suggesting right atrial pressure of 3 mmHg.    LHC 05/10/23   Mid RCA to Dist RCA lesion is 90% stenosed.   1st Diag lesion is 50% stenosed.   Prox LAD lesion is 90% stenosed.   Prox LAD to Mid LAD lesion is 30% stenosed.   Dist LAD lesion is 30% stenosed.    2nd Mrg lesion is 100% stenosed.   A drug-eluting stent was successfully placed using a STENT ONYX FRONTIER 3.5X15.   Post intervention, there is a 0% residual stenosis.   There is moderate left ventricular systolic dysfunction.   LV end diastolic pressure is moderately elevated.   The left ventricular ejection fraction is 35-45% by visual estimate.   1.  Severe underlying three-vessel coronary artery disease.  OM 2 appears to be chronically occluded with faint collaterals.  There is high-grade proximal LAD stenosis and severe stenosis in mid to distal right coronary artery with large organized thrombus and left to right collaterals. 2.  Moderately reduced LV systolic function with severe inferior wall hypokinesis.  Moderately elevated left ventricular end-diastolic pressure at 28 mmHg. 3.  Successful OCT guided PCI and drug-eluting stent placement to the proximal LAD.  A small distal edge dissection was noted by OCT but it was confined to 1 quadrant and nonflow limiting.  Thus, it was not treated.   Recommendations: Suspect that the RCA disease is subacute and given the presence of large organized thrombus, I felt that PCI would be associated with significant risk of distal embolization.  The vessel already gets collaterals from the LAD and the area  appears to be infarcted based on EKG and wall motion. Recommend dual antiplatelet therapy for at least 12 months. Aggressive treatment of risk factors. Optimize heart failure treatment. Risk Assessment/Calculations:             Physical Exam:   VS:  BP 93/64 (BP Location: Right Arm, Patient Position: Sitting, Cuff Size: Normal)   Pulse 87   Ht 5\' 11"  (1.803 m)   Wt 195 lb 9.6 oz (88.7 kg)   SpO2 96%   BMI 27.28 kg/m    Wt Readings from Last 3 Encounters:  06/16/23 195 lb 9.6 oz (88.7 kg)  05/10/23 204 lb 12.9 oz (92.9 kg)  04/12/23 119 lb 14.9 oz (54.4 kg)    GEN: Well nourished, well developed in no acute distress NECK: No JVD; No  carotid bruits CARDIAC: RRR, no murmurs, rubs, gallops RESPIRATORY:  Clear to auscultation without rales, wheezing or rhonchi  ABDOMEN: Soft, non-tender, non-distended EXTREMITIES:  No edema; No deformity   ASSESSMENT AND PLAN: .   Coronary artery disease of the native coronary artery with stable angina with recent NSTEMI 7/90/24.  Underwent left heart catheterization with successful PCI/DES to the LAD.  He was started on DAPT with aspirin and Brilinta.  He is also continued on Imdur and low-dose Toprol-XL.  EKG today reveals sinus rhythm with a rate of 87 with a chronic right bundle branch block with no ischemic changes noted.  He is being sent today for repeat BMP and CBC postprocedure.  He has had no issues from his right radial cath site.  Has been referred back to cardiac rehab.  Ischemic cardiomyopathy with moderately reduced LVEF of 40-45% during recent hospitalization for NSTEMI.  He denies any shortness of breath or peripheral edema today.  He has continued on lisinopril 40 mg daily and Toprol-XL 12.5 mg daily.  Will need to be scheduled echocardiogram on return to reevaluate function.  If continued to have reduced function will need to escalate GDMT.  Primary hypertension with blood pressure today 93/64.  Patient is concerned blood pressure is lower than normal.  He is tolerating Imdur, lisinopril, Toprol-XL, but unfortunately his amlodipine was discontinued during his hospitalization and he is continued to take it.  At this time he has not been advised to stop taking his amlodipine and continue the current medications.  He has also been encouraged to monitor his blood pressure 1 to 2 hours after his medications at home.  Hyperlipidemia with an LDL 91.  He is continued on atorvastatin 80 mg daily.  He will need follow-up lipid and hepatic panel in 8 to 10 weeks.  LP(a) of 62.8 may benefit from PCSK9 inhibitor and de-escalation of statin will determine after repeat labs.  Previous episodes of  sinus bradycardia with chronic right bundle branch block.  EKG today reveals sinus rhythm with a rate of 57 with chronic right bundle branch block.  He is continued on Toprol-XL 12.5 mg daily as with his lower blood pressure escalation of any medications is deferred today.  Tobacco abuse where he continues to smoke half a pack per day.  He is continued on nicotine patches and nicotine gum.  Total cessation continues to be recommended.   Cardiac Rehabilitation Eligibility Assessment         Dispo: Patient return to clinic to see MD/APP in 8 weeks or sooner if needed for reevaluation of symptoms  Signed, Traevon Meiring, NP

## 2023-06-16 ENCOUNTER — Other Ambulatory Visit: Payer: Self-pay

## 2023-06-16 ENCOUNTER — Encounter: Payer: Self-pay | Admitting: Cardiology

## 2023-06-16 ENCOUNTER — Ambulatory Visit: Payer: Medicaid Other | Attending: Cardiology | Admitting: Cardiology

## 2023-06-16 VITALS — BP 93/64 | HR 87 | Ht 71.0 in | Wt 195.6 lb

## 2023-06-16 DIAGNOSIS — I10 Essential (primary) hypertension: Secondary | ICD-10-CM

## 2023-06-16 DIAGNOSIS — I214 Non-ST elevation (NSTEMI) myocardial infarction: Secondary | ICD-10-CM

## 2023-06-16 DIAGNOSIS — E782 Mixed hyperlipidemia: Secondary | ICD-10-CM

## 2023-06-16 DIAGNOSIS — Z720 Tobacco use: Secondary | ICD-10-CM

## 2023-06-16 DIAGNOSIS — I4510 Unspecified right bundle-branch block: Secondary | ICD-10-CM

## 2023-06-16 DIAGNOSIS — I255 Ischemic cardiomyopathy: Secondary | ICD-10-CM

## 2023-06-16 DIAGNOSIS — I25118 Atherosclerotic heart disease of native coronary artery with other forms of angina pectoris: Secondary | ICD-10-CM

## 2023-06-16 LAB — CBC
Hematocrit: 51.2 % — ABNORMAL HIGH (ref 37.5–51.0)
Hemoglobin: 17.7 g/dL (ref 13.0–17.7)
MCH: 32.7 pg (ref 26.6–33.0)
MCHC: 34.6 g/dL (ref 31.5–35.7)
MCV: 95 fL (ref 79–97)
Platelets: 205 10*3/uL (ref 150–450)
RBC: 5.42 x10E6/uL (ref 4.14–5.80)
RDW: 14.3 % (ref 11.6–15.4)
WBC: 12 10*3/uL — ABNORMAL HIGH (ref 3.4–10.8)

## 2023-06-16 LAB — BASIC METABOLIC PANEL WITH GFR
BUN/Creatinine Ratio: 17 (ref 10–24)
BUN: 18 mg/dL (ref 8–27)
CO2: 22 mmol/L (ref 20–29)
Calcium: 10.1 mg/dL (ref 8.6–10.2)
Chloride: 101 mmol/L (ref 96–106)
Creatinine, Ser: 1.04 mg/dL (ref 0.76–1.27)
Glucose: 137 mg/dL — ABNORMAL HIGH (ref 70–99)
Potassium: 4 mmol/L (ref 3.5–5.2)
Sodium: 139 mmol/L (ref 134–144)
eGFR: 82 mL/min/1.73

## 2023-06-16 NOTE — Patient Instructions (Signed)
Medication Instructions:  Be sure to stop taking the amlodipine *If you need a refill on your cardiac medications before your next appointment, please call your pharmacy*   Lab Work: Your provider would like for you to have following labs drawn today CBC, BMP.   If you have labs (blood work) drawn today and your tests are completely normal, you will receive your results only by: MyChart Message (if you have MyChart) OR A paper copy in the mail If you have any lab test that is abnormal or we need to change your treatment, we will call you to review the results.   Testing/Procedures: none   Follow-Up: At [facility], you and your health needs are our priority.  As part of our continuing mission to provide you with exceptional heart care, we have created designated Provider Care Teams.  These Care Teams include your primary Cardiologist (physician) and Advanced Practice Providers (APPs -  Physician Assistants and Nurse Practitioners) who all work together to provide you with the care you need, when you need it.  We recommend signing up for the patient portal called "MyChart".  Sign up information is provided on this After Visit Summary.  MyChart is used to connect with patients for Virtual Visits (Telemedicine).  Patients are able to view lab/test results, encounter notes, upcoming appointments, etc.  Non-urgent messages can be sent to your provider as well.   To learn more about what you can do with MyChart, go to [email or web address].    Your next appointment:   8 week(s)  Provider:   Julien Nordmann, MD or Charlsie Quest, NP

## 2023-06-19 ENCOUNTER — Telehealth: Payer: Self-pay | Admitting: Cardiovascular Disease

## 2023-06-19 NOTE — Telephone Encounter (Signed)
Called to schedule 8 week follow up from 09/06 checkout Called dropped

## 2023-06-20 NOTE — Progress Notes (Signed)
Kidney function is stable. Slight elevated noted on white count. Recommend monitoring for fever and/or chills. Continue current medication regimen and keep scheduled appointments.

## 2023-06-21 ENCOUNTER — Other Ambulatory Visit: Payer: Self-pay | Admitting: *Deleted

## 2023-06-21 NOTE — Telephone Encounter (Signed)
Called to schedule. VM full.

## 2023-09-22 ENCOUNTER — Ambulatory Visit: Payer: Medicaid Other | Attending: Cardiology | Admitting: Cardiology

## 2023-09-22 ENCOUNTER — Encounter: Payer: Self-pay | Admitting: Cardiology

## 2023-09-22 VITALS — BP 135/80 | HR 85 | Ht 71.0 in | Wt 202.2 lb

## 2023-09-22 DIAGNOSIS — I10 Essential (primary) hypertension: Secondary | ICD-10-CM

## 2023-09-22 DIAGNOSIS — I214 Non-ST elevation (NSTEMI) myocardial infarction: Secondary | ICD-10-CM | POA: Diagnosis not present

## 2023-09-22 DIAGNOSIS — I25118 Atherosclerotic heart disease of native coronary artery with other forms of angina pectoris: Secondary | ICD-10-CM

## 2023-09-22 DIAGNOSIS — I255 Ischemic cardiomyopathy: Secondary | ICD-10-CM

## 2023-09-22 DIAGNOSIS — E782 Mixed hyperlipidemia: Secondary | ICD-10-CM | POA: Diagnosis not present

## 2023-09-22 DIAGNOSIS — Z720 Tobacco use: Secondary | ICD-10-CM

## 2023-09-22 MED ORDER — ISOSORBIDE MONONITRATE ER 30 MG PO TB24: 30.0000 mg | ORAL_TABLET | Freq: Every day | ORAL | 3 refills | Status: DC

## 2023-09-22 MED ORDER — ATORVASTATIN CALCIUM 80 MG PO TABS: 80.0000 mg | ORAL_TABLET | Freq: Every day | ORAL | 3 refills | Status: DC

## 2023-09-22 MED ORDER — TICAGRELOR 90 MG PO TABS: 90.0000 mg | ORAL_TABLET | Freq: Two times a day (BID) | ORAL | 3 refills | Status: DC

## 2023-09-22 NOTE — Patient Instructions (Signed)
Medication Instructions:  - No changes *If you need a refill on your cardiac medications before your next appointment, please call your pharmacy*  Lab Work: Your provider would like for you to have following labs drawn today BMET, CBC, & direct LDL.   If you have labs (blood work) drawn today and your tests are completely normal, you will receive your results only by: MyChart Message (if you have MyChart) OR A paper copy in the mail If you have any lab test that is abnormal or we need to change your treatment, we will call you to review the results.  Testing/Procedures: Your physician has requested that you have an echocardiogram. Echocardiography is a painless test that uses sound waves to create images of your heart. It provides your doctor with information about the size and shape of your heart and how well your heart's chambers and valves are working. This procedure takes approximately one hour. There are no restrictions for this procedure. Please do NOT wear cologne, perfume, aftershave, or lotions (deodorant is allowed). Please arrive 15 minutes prior to your appointment time.  Please note: We ask at that you not bring children with you during ultrasound (echo/ vascular) testing. Due to room size and safety concerns, children are not allowed in the ultrasound rooms during exams. Our front office staff cannot provide observation of children in our lobby area while testing is being conducted. An adult accompanying a patient to their appointment will only be allowed in the ultrasound room at the discretion of the ultrasound technician under special circumstances. We apologize for any inconvenience.   Follow-Up: At [facility], you and your health needs are our priority.  As part of our continuing mission to provide you with exceptional heart care, we have created designated Provider Care Teams.  These Care Teams include your primary Cardiologist (physician) and Advanced Practice  Providers (APPs -  Physician Assistants and Nurse Practitioners) who all work together to provide you with the care you need, when you need it.  Your next appointment:   3 month(s)  Provider:   Charlsie Quest, NP

## 2023-09-22 NOTE — Progress Notes (Signed)
Cardiology Office Note:  .   Date:  09/22/2023  ID:  Micheal Barrett, DOB June 23, 1961, MRN [id number] PCP: [facility] Providers Cardiologist:  Julien Nordmann, MD    History of Present Illness: .   Micheal Barrett is a 62 y.o. male with a past medical history of hypertension, diabetes, diastolic dysfunction, PSVT, chronic right bundle branch block, tobacco abuse, gout, migraines, coronary artery disease who is here today for follow-up.   Previous Myoview completed in 07/2016 which showed a small region of ischemia of mild severity in the basal lateral wall, EF 51% without regional wall motion abnormality was low to moderate risk.  Echocardiogram in 2017 showed an LVEF of 55 to 60%, no RWMA, G1 DD, mild BAE, no significant valvular disease.  ZIO monitor in 2017 showed 4 episodes of SVT likely atrial tach with the fastest 152 x 13 beats, longest 16 beats at 143 bpm triggered events for sinus rhythm. He presented to the [facility] emergency department 05/09/2023 stating approximate 4 weeks ago he had a prednisone injection for hip pain and shortly thereafter he had an episode of chest fullness that occurred while watching TV.  He thought it was just an isolated episode and subsequently did well.  He was at work doing some inventory on 7/30 he developed recurrent severe chest fullness associated with dyspnea and profound diaphoresis.  He went outside took some deep breaths and symptoms resolved in about 15 minutes.  Went back into work and his symptoms immediately reoccurred.  At this point he went to his car and drove self to the emergency department.  Symptoms resolved prior to arrival to the ED.  EKG revealed chronic right bundle branch block with a new inferior Q waves.  High-sensitivity troponin of 436 and 511.  Labs were otherwise notable for polycythemia due to being a heavy smoker and hypoglycemia.  Chest x-ray without acute findings.  CT of the chest negative for PE but did  show aortic atherosclerosis and emphysema.  He was admitted and placed on IV heparin and nitro.  He had brief episodes of mild chest discomfort overnight but the next morning remained symptom free.  He was scheduled for left heart catheterization later that afternoon holding lunch.  She had severe underlying three-vessel coronary artery disease in the OM 2 appears to be chronically occluded with faint collaterals.  There was high-grade proximal LAD stenosis and severe stenosis in the mid to distal right coronary artery with large organized thrombus and left-to-right collaterals.  Moderately reduced LV systolic function with severe inferior wall hypokinesis.  Moderately elevated left ventricular end-diastolic pressure 28 mmHg.  He underwent successful OCT guided PCI with DES placement to the proximal LAD.  A small distal edge dissection was noted by OTC but it was confined to 1 quadrant and nonflow limiting and thus was not treated.  He was started on dual antiplatelet therapy with recommendations for minimum of 12 months of aspirin and Brilinta, aggressive treatment of risk factors, and optimization of heart failure medications.  Echocardiogram revealed an LVEF 40-45%, with moderate concentric left ventricular hypertrophy, G2 DD, mild mitral regurgitation, mild calcification of the aortic valve.  Patient was continued on aspirin and Brilinta as well as Imdur and metoprolol with a decreased use and amlodipine was discontinued.  He was considered stable and was subsequently discharged from the hospital on 05/11/2023.   He was last seen in clinic 06/16/2023.  At that time he was overall doing well from  a cardiac perspective.  Blood pressure was a little lower and he had been having some fatigue but all in all he was doing well.  He was sent for postprocedure labs and referred back to cardiac rehab.  Discussed scheduling for an echocardiogram on return.  He was also advised to stop taking amlodipine to allow for more  blood pressure.  He returns to clinic today stating the overall her has been doing well.  He states he is still not heard from cardiac rehab.  He has returned to work.  He has noted some occasional dyspnea on exertion.  Unfortunately he is also continued to smoke approximately 3 cigarettes/day.  He stated that he has not had his blood pressure medication yet today but typically has been compliant with his medications.  Denies any hospitalizations or visits to the emergency department.  ROS: 10 point review of systems has been reviewed and considered negative with exception was been listed in the HPI  Studies Reviewed: Marland Kitchen        TTE 05/10/23 1. Left ventricular ejection fraction, by estimation, is 40 to 45%. The  left ventricle has mildly decreased function. The left ventricle  demonstrates regional wall motion abnormalities (hypokinesis of the  inferior wall). There is moderate concerntric  left ventricular hypertrophy including the apical region. Left ventricular  diastolic parameters are consistent with Grade II diastolic dysfunction  (pseudonormalization).   2. Right ventricular systolic function is normal. The right ventricular  size is normal. Tricuspid regurgitation signal is inadequate for assessing  PA pressure.   3. Left atrial size was moderately dilated.   4. The mitral valve is normal in structure. Mild mitral valve  regurgitation. No evidence of mitral stenosis.   5. The aortic valve is normal in structure. There is mild calcification  of the aortic valve. Aortic valve regurgitation is not visualized. Aortic  valve sclerosis/calcification is present, without any evidence of aortic  stenosis.   6. The inferior vena cava is normal in size with greater than 50%  respiratory variability, suggesting right atrial pressure of 3 mmHg.     LHC 05/10/23   Mid RCA to Dist RCA lesion is 90% stenosed.   1st Diag lesion is 50% stenosed.   Prox LAD lesion is 90% stenosed.   Prox LAD to  Mid LAD lesion is 30% stenosed.   Dist LAD lesion is 30% stenosed.   2nd Mrg lesion is 100% stenosed.   A drug-eluting stent was successfully placed using a STENT ONYX FRONTIER 3.5X15.   Post intervention, there is a 0% residual stenosis.   There is moderate left ventricular systolic dysfunction.   LV end diastolic pressure is moderately elevated.   The left ventricular ejection fraction is 35-45% by visual estimate.   1.  Severe underlying three-vessel coronary artery disease.  OM 2 appears to be chronically occluded with faint collaterals.  There is high-grade proximal LAD stenosis and severe stenosis in mid to distal right coronary artery with large organized thrombus and left to right collaterals. 2.  Moderately reduced LV systolic function with severe inferior wall hypokinesis.  Moderately elevated left ventricular end-diastolic pressure at 28 mmHg. 3.  Successful OCT guided PCI and drug-eluting stent placement to the proximal LAD.  A small distal edge dissection was noted by OCT but it was confined to 1 quadrant and nonflow limiting.  Thus, it was not treated.   Recommendations: Suspect that the RCA disease is subacute and given the presence of large organized thrombus, I  felt that PCI would be associated with significant risk of distal embolization.  The vessel already gets collaterals from the LAD and the area appears to be infarcted based on EKG and wall motion. Recommend dual antiplatelet therapy for at least 12 months. Aggressive treatment of risk factors. Optimize heart failure treatment. Risk Assessment/Calculations:             Physical Exam:   VS:  BP 135/80 (BP Location: Left Arm, Patient Position: Sitting, Cuff Size: Large)   Pulse 85   Ht 5\' 11"  (1.803 m)   Wt 202 lb 3.2 oz (91.7 kg)   SpO2 98%   BMI 28.20 kg/m    Wt Readings from Last 3 Encounters:  09/22/23 202 lb 3.2 oz (91.7 kg)  06/16/23 195 lb 9.6 oz (88.7 kg)  05/10/23 204 lb 12.9 oz (92.9 kg)    GEN: Well  nourished, well developed in no acute distress NECK: No JVD; No carotid bruits CARDIAC: RRR, no murmurs, rubs, gallops RESPIRATORY:  Clear to auscultation without rales, wheezing or rhonchi  ABDOMEN: Soft, non-tender, non-distended EXTREMITIES:  No edema; No deformity   ASSESSMENT AND PLAN: .   Coronary artery disease of native coronary artery with stable angina with recent NSTEMI 04/18/2023.  Underwent left heart catheterization with successful PCI/DES to the LAD.  He was started on DAPT of aspirin and Brilinta for minimum of 12 months.  He is also continued on Imdur and low-dose Toprol-XL.  He is being sent for repeat labs today for BMP and a CBC and a direct LDL as he did not have lab work done after his last visit.  Continues to remain chest pain-free.  Does require refills on his Lipitor for and Brilinta today sent to the pharmacy of choice.  Ischemic cardiomyopathy with moderately reduced LVEF of 40-45% on recent hospitalization for NSTEMI.  Denies any shortness of breath and peripheral edema.  He is continued on lisinopril 40 mg daily Toprol-XL 12.5 mg daily.  He has been scheduled for limited echocardiogram to reevaluate function.  If continued after dysfunction will need to be escalated on GDMT.  Primary hypertension with a blood pressure today of 135/80 has not had all of his blood pressure medications today.  He is continued on Imdur, lisinopril, and Toprol-XL.  At his last visit his amlodipine had to be discontinued due to hypotensive episodes.  He has been encouraged to continue to monitor his blood pressure 1 to 2 hours postmedication ministration at home as well.  Hyperlipidemia with his last LDL 91.  He is continued on atorvastatin 80 mg daily and he is being sent for direct LDL today he has recently he did not have labs done.  Tobacco abuse where he continues to smoke 3 to 4 cigarettes/day.  He is continued on nicotine patches and nicotine gum.  He has tried to decrease in is motivated  to stop.  Total cessation continues to be recommended.   Cardiac Rehabilitation Eligibility Assessment         Dispo: Patient return to clinic to see me/APP in 3 months or sooner if needed for further evaluation and follow-up.  Signed, Chike Farrington, NP

## 2023-09-23 LAB — BASIC METABOLIC PANEL
BUN/Creatinine Ratio: 12 (ref 10–24)
BUN: 12 mg/dL (ref 8–27)
CO2: 22 mmol/L (ref 20–29)
Calcium: 9.8 mg/dL (ref 8.6–10.2)
Chloride: 103 mmol/L (ref 96–106)
Creatinine, Ser: 0.97 mg/dL (ref 0.76–1.27)
Glucose: 111 mg/dL — ABNORMAL HIGH (ref 70–99)
Potassium: 4.4 mmol/L (ref 3.5–5.2)
Sodium: 142 mmol/L (ref 134–144)
eGFR: 89 mL/min/{1.73_m2} (ref >59)

## 2023-09-23 LAB — CBC
Hematocrit: 47.3 % (ref 37.5–51.0)
Hemoglobin: 16.3 g/dL (ref 13.0–17.7)
MCH: 35.2 pg — ABNORMAL HIGH (ref 26.6–33.0)
MCHC: 34.5 g/dL (ref 31.5–35.7)
MCV: 102 fL — ABNORMAL HIGH (ref 79–97)
Platelets: 206 10*3/uL (ref 150–450)
RBC: 4.63 x10E6/uL (ref 4.14–5.80)
RDW: 15.5 % — ABNORMAL HIGH (ref 11.6–15.4)
WBC: 7.7 10*3/uL (ref 3.4–10.8)

## 2023-09-23 LAB — LDL CHOLESTEROL, DIRECT: LDL Direct: 72 mg/dL (ref 0–99)

## 2023-09-25 NOTE — Progress Notes (Signed)
Labs remain stable.  LDL improving.  Kidney function stable.  Blood counts are stable without concerns for infection with normal WBCs.  Continue current medication regimen without changes at this time.

## 2023-10-16 ENCOUNTER — Ambulatory Visit: Payer: Medicaid Other | Attending: Cardiology

## 2023-10-16 DIAGNOSIS — I25118 Atherosclerotic heart disease of native coronary artery with other forms of angina pectoris: Secondary | ICD-10-CM | POA: Diagnosis not present

## 2023-10-16 DIAGNOSIS — E782 Mixed hyperlipidemia: Secondary | ICD-10-CM

## 2023-10-16 DIAGNOSIS — I252 Old myocardial infarction: Secondary | ICD-10-CM

## 2023-10-16 DIAGNOSIS — I10 Essential (primary) hypertension: Secondary | ICD-10-CM

## 2023-10-16 DIAGNOSIS — I214 Non-ST elevation (NSTEMI) myocardial infarction: Secondary | ICD-10-CM

## 2023-10-16 LAB — ECHOCARDIOGRAM LIMITED
Area-P 1/2: 2.56 cm2
S' Lateral: 3.5 cm

## 2023-10-16 MED ORDER — PERFLUTREN LIPID MICROSPHERE
1.0000 mL | INTRAVENOUS | Status: AC | PRN
  Administered 2023-10-16: 3 mL via INTRAVENOUS

## 2023-10-18 NOTE — Progress Notes (Signed)
 Heart squeezes noted at 50 to 55% with normal function.  No wall motion abnormalities were noted.  Mild thickening of the left lower chamber of the heart and some stiffness likely related to age and or high blood pressure.  There is mild to moderate leakage noted in the mitral valve.  No other valvular abnormalities were noted.

## 2023-12-22 ENCOUNTER — Other Ambulatory Visit: Payer: Self-pay | Admitting: Cardiology

## 2024-01-01 ENCOUNTER — Ambulatory Visit: Payer: Medicaid Other | Attending: Cardiology | Admitting: Cardiology

## 2024-01-01 NOTE — Progress Notes (Deleted)
 Cardiology Office Note:  .   Date:  01/01/2024  ID:  Micheal Barrett, DOB 08/16/1961, MRN [id number] PCP: [facility], [facility] Providers Cardiologist:  Julien Nordmann, MD { Click to update primary MD,subspecialty MD or APP then REFRESH:1}   History of Present Illness: .   Micheal Barrett is a 63 y.o. male with a past medical history of hypertension, diabetes, ischemic cardiomyopathy with reduced EF post MI that improved after intervention, PSVT, chronic right bundle branch block, tobacco abuse, gout, migraines, coronary artery disease who is here for follow-up on his coronary artery disease.   Previous Myoview completed in 07/2016 which showed a small region of ischemia of mild severity in the basal lateral wall, EF 51% without regional wall motion abnormality was low to moderate risk.  Echocardiogram in 2017 showed an LVEF of 55 to 60%, no RWMA, G1 DD, mild BAE, no significant valvular disease.  ZIO monitor in 2017 showed 4 episodes of SVT likely atrial tach with the fastest 152 x 13 beats, longest 16 beats at 143 bpm triggered events for sinus rhythm. He presented to the [facility] emergency department 05/09/2023 stating approximate 4 weeks ago he had a prednisone injection for hip pain and shortly thereafter he had an episode of chest fullness that occurred while watching TV.  He thought it was just an isolated episode and subsequently did well.  He was at work doing some inventory on 7/30 he developed recurrent severe chest fullness associated with dyspnea and profound diaphoresis.  He went outside took some deep breaths and symptoms resolved in about 15 minutes.  Went back into work and his symptoms immediately reoccurred.  At this point he went to his car and drove self to the emergency department.  Symptoms resolved prior to arrival to the ED.  EKG revealed chronic right bundle branch block with a new inferior Q waves.  High-sensitivity troponin of 436 and 511.  Labs were otherwise  notable for polycythemia due to being a heavy smoker and hypoglycemia.  Chest x-ray without acute findings.  CT of the chest negative for PE but did show aortic atherosclerosis and emphysema.  He was admitted and placed on IV heparin and nitro.  He had brief episodes of mild chest discomfort overnight but the next morning remained symptom free.  He was scheduled for left heart catheterization later that afternoon holding lunch.  She had severe underlying three-vessel coronary artery disease in the OM 2 appears to be chronically occluded with faint collaterals.  There was high-grade proximal LAD stenosis and severe stenosis in the mid to distal right coronary artery with large organized thrombus and left-to-right collaterals.  Moderately reduced LV systolic function with severe inferior wall hypokinesis.  Moderately elevated left ventricular end-diastolic pressure 28 mmHg.  He underwent successful OCT guided PCI with DES placement to the proximal LAD.  A small distal edge dissection was noted by OTC but it was confined to 1 quadrant and nonflow limiting and thus was not treated.  He was started on dual antiplatelet therapy with recommendations for minimum of 12 months of aspirin and Brilinta, aggressive treatment of risk factors, and optimization of heart failure medications.  Echocardiogram revealed an LVEF 40-45%, with moderate concentric left ventricular hypertrophy, G2 DD, mild mitral regurgitation, mild calcification of the aortic valve.  Patient was continued on aspirin and Brilinta as well as Imdur and metoprolol with a decreased use and amlodipine was discontinued.  He was considered stable and was subsequently discharged from  the hospital on 05/11/2023.  He was seen in clinic 06/16/2023 at that time overall was doing well with cardiac perspective.  Blood pressure was a little low when he was having some fatigue.  He was sent for postprocedure labs referred back to cardiac rehab.  They discussed scheduling  echocardiogram on return and he was advised to stop taking amlodipine to allow for increase in blood pressure.   He was last seen in clinic 09/22/2023 and overall been doing well.  Stated he had still not heard from cardiac rehab.  Noted occasional dyspnea on exertion but attributed to his continued smoking.  States he has been compliant with his medication regimen and denied any hospitalizations or visits to the emergency department.  He returns to clinic today  ROS: 10 point review of system has been reviewed and considered negative except ones been listed in the HPI  Studies Reviewed: .       Limited 2D echo 10/16/2023 1. Left ventricular ejection fraction, by estimation, is 50 to 55%. Left  ventricular ejection fraction by PLAX is 53 %. The left ventricle has low  normal function. The left ventricle has no regional wall motion  abnormalities. There is moderate  concentric left ventricular hypertrophy mid cavity extending distally,  unable to exclude regions of severe apical hypertrophy. Left ventricular  diastolic parameters are consistent with Grade I diastolic dysfunction  (impaired relaxation).   2. Right ventricular systolic function is normal. The right ventricular  size is normal.   3. The mitral valve is normal in structure. Mild to moderate mitral valve  regurgitation. No evidence of mitral stenosis.   4. The aortic valve is tricuspid. Aortic valve regurgitation is not  visualized. Aortic valve sclerosis is present, with no evidence of aortic  valve stenosis.   5. The inferior vena cava is normal in size with greater than 50%  respiratory variability, suggesting right atrial pressure of 3 mmHg.  TTE 05/10/23 1. Left ventricular ejection fraction, by estimation, is 40 to 45%. The  left ventricle has mildly decreased function. The left ventricle  demonstrates regional wall motion abnormalities (hypokinesis of the  inferior wall). There is moderate concerntric  left ventricular  hypertrophy including the apical region. Left ventricular  diastolic parameters are consistent with Grade II diastolic dysfunction  (pseudonormalization).   2. Right ventricular systolic function is normal. The right ventricular  size is normal. Tricuspid regurgitation signal is inadequate for assessing  PA pressure.   3. Left atrial size was moderately dilated.   4. The mitral valve is normal in structure. Mild mitral valve  regurgitation. No evidence of mitral stenosis.   5. The aortic valve is normal in structure. There is mild calcification  of the aortic valve. Aortic valve regurgitation is not visualized. Aortic  valve sclerosis/calcification is present, without any evidence of aortic  stenosis.   6. The inferior vena cava is normal in size with greater than 50%  respiratory variability, suggesting right atrial pressure of 3 mmHg.     LHC 05/10/23   Mid RCA to Dist RCA lesion is 90% stenosed.   1st Diag lesion is 50% stenosed.   Prox LAD lesion is 90% stenosed.   Prox LAD to Mid LAD lesion is 30% stenosed.   Dist LAD lesion is 30% stenosed.   2nd Mrg lesion is 100% stenosed.   A drug-eluting stent was successfully placed using a STENT ONYX FRONTIER 3.5X15.   Post intervention, there is a 0% residual stenosis.   There  is moderate left ventricular systolic dysfunction.   LV end diastolic pressure is moderately elevated.   The left ventricular ejection fraction is 35-45% by visual estimate.   1.  Severe underlying three-vessel coronary artery disease.  OM 2 appears to be chronically occluded with faint collaterals.  There is high-grade proximal LAD stenosis and severe stenosis in mid to distal right coronary artery with large organized thrombus and left to right collaterals. 2.  Moderately reduced LV systolic function with severe inferior wall hypokinesis.  Moderately elevated left ventricular end-diastolic pressure at 28 mmHg. 3.  Successful OCT guided PCI and drug-eluting stent  placement to the proximal LAD.  A small distal edge dissection was noted by OCT but it was confined to 1 quadrant and nonflow limiting.  Thus, it was not treated.   Recommendations: Suspect that the RCA disease is subacute and given the presence of large organized thrombus, I felt that PCI would be associated with significant risk of distal embolization.  The vessel already gets collaterals from the LAD and the area appears to be infarcted based on EKG and wall motion. Recommend dual antiplatelet therapy for at least 12 months. Aggressive treatment of risk factors. Optimize heart failure treatment. Risk Assessment/Calculations:     No BP recorded.  {Refresh Note OR Click here to enter BP  :1}***       Physical Exam:   VS:  There were no vitals taken for this visit.   Wt Readings from Last 3 Encounters:  09/22/23 202 lb 3.2 oz (91.7 kg)  06/16/23 195 lb 9.6 oz (88.7 kg)  05/10/23 204 lb 12.9 oz (92.9 kg)    GEN: Well nourished, well developed in no acute distress NECK: No JVD; No carotid bruits CARDIAC: ***RRR, no murmurs, rubs, gallops RESPIRATORY:  Clear to auscultation without rales, wheezing or rhonchi  ABDOMEN: Soft, non-tender, non-distended EXTREMITIES:  No edema; No deformity   ASSESSMENT AND PLAN: .   Coronary artery disease, native coronary artery with stable angina and recent NSTEMI 04/18/2023. Ischemic cardiomyopathy with moderately reduced LVEF Primary hypertension Hyperlipidemia Tobacco abuse    {Are you ordering a CV Procedure (e.g. stress test, cath, DCCV, TEE, etc)?   Press F2        :[id number]}  Dispo: ***  Signed, Lillia Lengel, NP

## 2024-02-23 ENCOUNTER — Emergency Department
Admission: EM | Admit: 2024-02-23 | Discharge: 2024-02-23 | Disposition: A | Discharge status: Home / Self Care | Attending: Emergency Medicine | Admitting: Emergency Medicine

## 2024-02-23 ENCOUNTER — Other Ambulatory Visit: Payer: Self-pay

## 2024-02-23 ENCOUNTER — Emergency Department

## 2024-02-23 DIAGNOSIS — W1839XA Other fall on same level, initial encounter: Secondary | ICD-10-CM | POA: Diagnosis not present

## 2024-02-23 DIAGNOSIS — S20212A Contusion of left front wall of thorax, initial encounter: Secondary | ICD-10-CM | POA: Diagnosis not present

## 2024-02-23 DIAGNOSIS — M25562 Pain in left knee: Secondary | ICD-10-CM | POA: Diagnosis not present

## 2024-02-23 DIAGNOSIS — S29001A Unspecified injury of muscle and tendon of front wall of thorax, initial encounter: Secondary | ICD-10-CM | POA: Diagnosis present

## 2024-02-23 DIAGNOSIS — W19XXXA Unspecified fall, initial encounter: Secondary | ICD-10-CM

## 2024-02-23 MED ORDER — LIDOCAINE 5 % EX PTCH: 1.0000 | MEDICATED_PATCH | CUTANEOUS | 0 refills | Status: AC

## 2024-02-23 MED ORDER — METHOCARBAMOL 500 MG PO TABS: 500.0000 mg | ORAL_TABLET | Freq: Three times a day (TID) | ORAL | 0 refills | Status: AC | PRN

## 2024-02-23 MED ORDER — LIDOCAINE 5 % EX PTCH
1.0000 | MEDICATED_PATCH | Freq: Once | CUTANEOUS | Status: DC
Start: 2024-02-23 — End: 2024-02-23
  Administered 2024-02-23: 1 via TRANSDERMAL
  Filled 2024-02-23: qty 1

## 2024-02-23 NOTE — ED Provider Notes (Signed)
 [facility] Provider Note    Event Date/Time   First MD Initiated Contact with Patient 02/23/24 1107     (approximate)   History   Fall   HPI Micheal Barrett is a 63 y.o. male presenting today for fall.  Patient states he was moving furniture at the house when he stumbled and hit the left flank into a piece of furniture.  He has pain along his rib cage on that side but denies any obvious bruising or deformities.  Had slight left knee pain but ambulating on that and in no acute distress.  Denies hitting his head or pain symptoms elsewhere.     Physical Exam   Triage Vital Signs: ED Triage Vitals  Encounter Vitals Group     BP 02/23/24 1012 (!) 133/98     Systolic BP Percentile --      Diastolic BP Percentile --      Pulse Rate 02/23/24 1012 93     Resp 02/23/24 1012 18     Temp 02/23/24 1012 98 F (36.7 C)     Temp src --      SpO2 02/23/24 1012 100 %     Weight 02/23/24 1010 218 lb (98.9 kg)     Height 02/23/24 1010 5\' 11"  (1.803 m)     Head Circumference --      Peak Flow --      Pain Score 02/23/24 1010 8     Pain Loc --      Pain Education --      Exclude from Growth Chart --     Most recent vital signs: Vitals:   02/23/24 1012  BP: (!) 133/98  Pulse: 93  Resp: 18  Temp: 98 F (36.7 C)  SpO2: 100%   I have reviewed the vital signs. General:  Awake, alert, no acute distress. Head:  Normocephalic, Atraumatic. EENT:  PERRL, EOMI, Oral mucosa pink and moist, Neck is supple. Cardiovascular: Regular rate, 2+ distal pulses. Respiratory:  Normal respiratory effort, symmetrical expansion, no distress.   Chest wall: Slight tenderness palpation along the left lateral rib cage without obvious deformity or bruising Abdomen: Soft, nontender, nondistended Extremities:  Moving all four extremities through full ROM without pain.  No tenderness palpation throughout bilateral upper or lower extremities and specifically no pain in the left  knee Neuro:  Alert and oriented.  Interacting appropriately.   Skin:  Warm, dry, no rash.   Psych: Appropriate affect.    ED Results / Procedures / Treatments   Labs (all labs ordered are listed, but only abnormal results are displayed) Labs Reviewed - No data to display   EKG    RADIOLOGY Independently interpreted left-sided rib x-rays with no acute abnormalities   PROCEDURES:  Critical Care performed: No  Procedures   MEDICATIONS ORDERED IN ED: Medications  lidocaine  (LIDODERM ) 5 % 1 patch (1 patch Transdermal Patch Applied 02/23/24 1121)     IMPRESSION / MDM / ASSESSMENT AND PLAN / ED COURSE  I reviewed the triage vital signs and the nursing notes.                              Differential diagnosis includes, but is not limited to, rib fracture, rib hematoma  Patient's presentation is most consistent with acute complicated illness / injury requiring diagnostic workup.  Patient is a 63 year old male presenting today for fall with left rib injury.  X-ray  showed no evidence of fracture.  Most likely rib contusion.  Oxygenating well and no tachypnea.  Had slight pain in his left knee but did not want x-rays as he is ambulating fine on this time.  Will send out with Lidoderm  and Robaxin.  Given strict return precautions for any worsening symptoms.  Patient agreeable with plan.     FINAL CLINICAL IMPRESSION(S) / ED DIAGNOSES   Final diagnoses:  Fall, initial encounter  Contusion of rib on left side, initial encounter     Rx / DC Orders   ED Discharge Orders          Ordered    lidocaine  (LIDODERM ) 5 %  Every 24 hours        02/23/24 1117    methocarbamol (ROBAXIN) 500 MG tablet  Every 8 hours PRN        02/23/24 1117             Note:  This document was prepared using Dragon voice recognition software and may include unintentional dictation errors.   Kandee Orion, MD 02/23/24 [phone]

## 2024-02-23 NOTE — ED Triage Notes (Signed)
 Pt comes with fall and left rib pain. Pt states he was helping brother move some furniture and got pushed back and fell. Pt states left knee pain and swelling also. Pt denies any loc or hitting head.

## 2024-02-23 NOTE — Discharge Instructions (Signed)
 X-ray showed no obvious fractures of your rib cage today.  I have sent medication to your pharmacy to take as needed.  You can also use Tylenol  and ibuprofen  at home.  Please follow-up with your primary care provider for any worsening symptoms.

## 2024-04-03 ENCOUNTER — Other Ambulatory Visit (HOSPITAL_COMMUNITY): Payer: Self-pay

## 2024-04-03 ENCOUNTER — Telehealth: Payer: Self-pay

## 2024-04-03 NOTE — Telephone Encounter (Signed)
 Pharmacy Patient Advocate Encounter  Insurance verification completed.   The patient is insured through [facility] [employer]   Ran test claim for BRILINTA . Currently a quantity of 180 is a 90 day supply and the co-pay is $4 . The current 90 day co-pay is, $4.  No PA needed at this time.  This test claim was processed through [facility]- copay amounts may vary at other pharmacies due to pharmacy/plan contracts, or as the patient moves through the different stages of their insurance plan.   Pharmacy notified of paid claim, no PA needed.

## 2024-06-21 ENCOUNTER — Other Ambulatory Visit (HOSPITAL_COMMUNITY): Payer: Self-pay

## 2024-06-21 ENCOUNTER — Other Ambulatory Visit: Payer: Self-pay | Admitting: Cardiology

## 2024-06-21 ENCOUNTER — Telehealth: Payer: Self-pay | Admitting: Pharmacy Technician

## 2024-06-21 NOTE — Telephone Encounter (Signed)
 Pharmacy Patient Advocate Encounter   Received notification from Fax that prior authorization for Brilinta  is required/requested.   Insurance verification completed.   The patient is insured through [employer] .   Per test claim: The current 06/21/24 day co-pay is, $4.00.  No PA needed at this time. This test claim was processed through [facility]- copay amounts may vary at other pharmacies due to pharmacy/plan contracts, or as the patient moves through the different stages of their insurance plan.     Must use DAW 9 to process claims

## 2024-08-06 ENCOUNTER — Other Ambulatory Visit (HOSPITAL_COMMUNITY): Payer: Self-pay

## 2024-08-06 ENCOUNTER — Telehealth: Payer: Self-pay | Admitting: Pharmacy Technician

## 2024-08-06 ENCOUNTER — Other Ambulatory Visit: Payer: Self-pay

## 2024-08-06 MED ORDER — BRILINTA 90 MG PO TABS
90.0000 mg | ORAL_TABLET | Freq: Two times a day (BID) | ORAL | 3 refills | Status: AC
  Filled 2024-08-06: qty 90, 45d supply, fill #0

## 2024-08-06 NOTE — Progress Notes (Signed)
 Brilinta  sent into patient pharmacy.

## 2024-08-06 NOTE — Addendum Note (Signed)
 Addended by: BRIEN SALM on: 08/06/2024 11:52 AM   Modules accepted: Orders

## 2024-08-06 NOTE — Telephone Encounter (Signed)
 Hi, insurance will pay for brand brilinta -copay 4.00. Can someone please send in the prescription as daw y1 -brand per provider to the pharmacy? Thank you

## 2024-08-07 ENCOUNTER — Other Ambulatory Visit (HOSPITAL_COMMUNITY): Payer: Self-pay

## 2024-09-21 ENCOUNTER — Other Ambulatory Visit: Payer: Self-pay | Admitting: Cardiology

## 2024-10-23 ENCOUNTER — Other Ambulatory Visit: Payer: Self-pay | Admitting: Cardiovascular Disease

## 2024-10-24 NOTE — Telephone Encounter (Signed)
 In accordance with refill protocols, please review and address the following requirements before this medication refill can be authorized:  Labs
